# Patient Record
Sex: Female | Born: 1935 | Race: White | Hispanic: No | Marital: Married | Smoking: Former smoker
Health system: Southern US, Community
[De-identification: ages and names within clinical notes are randomized; demographics above are authoritative.]

## PROBLEM LIST (undated history)

## (undated) DIAGNOSIS — R7302 Impaired glucose tolerance (oral): Secondary | ICD-10-CM

## (undated) DIAGNOSIS — I10 Essential (primary) hypertension: Secondary | ICD-10-CM

## (undated) DIAGNOSIS — E079 Disorder of thyroid, unspecified: Secondary | ICD-10-CM

## (undated) DIAGNOSIS — E785 Hyperlipidemia, unspecified: Secondary | ICD-10-CM

## (undated) DIAGNOSIS — E7800 Pure hypercholesterolemia, unspecified: Secondary | ICD-10-CM

## (undated) DIAGNOSIS — E119 Type 2 diabetes mellitus without complications: Secondary | ICD-10-CM

## (undated) HISTORY — DX: Essential (primary) hypertension: I10

## (undated) HISTORY — DX: Impaired glucose tolerance (oral): R73.02

## (undated) HISTORY — DX: Hyperlipidemia, unspecified: E78.5

## (undated) HISTORY — PX: OTHER SURGICAL HISTORY: SHX169

## (undated) HISTORY — PX: COLONOSCOPY: SHX174

## (undated) HISTORY — PX: TUBAL LIGATION: SHX77

## (undated) HISTORY — PX: APPENDECTOMY: SHX54

---

## 2003-02-27 ENCOUNTER — Encounter: Payer: Self-pay | Admitting: Family Medicine

## 2003-02-27 ENCOUNTER — Ambulatory Visit (HOSPITAL_COMMUNITY): Admission: RE | Admit: 2003-02-27 | Discharge: 2003-02-27 | Payer: Self-pay | Admitting: Family Medicine

## 2003-07-08 ENCOUNTER — Ambulatory Visit (HOSPITAL_BASED_OUTPATIENT_CLINIC_OR_DEPARTMENT_OTHER): Admission: RE | Admit: 2003-07-08 | Discharge: 2003-07-08 | Payer: Self-pay | Admitting: Orthopedic Surgery

## 2009-02-05 ENCOUNTER — Telehealth (INDEPENDENT_AMBULATORY_CARE_PROVIDER_SITE_OTHER): Payer: Self-pay | Admitting: *Deleted

## 2009-02-06 ENCOUNTER — Encounter (INDEPENDENT_AMBULATORY_CARE_PROVIDER_SITE_OTHER): Payer: Self-pay | Admitting: *Deleted

## 2010-05-01 ENCOUNTER — Telehealth: Payer: Self-pay | Admitting: Internal Medicine

## 2011-01-26 NOTE — Progress Notes (Signed)
Summary: Change in GI care  Phone Note Outgoing Call Call back at [facility] Phone [phone]   Call placed by: Harlow Mares CMA Duncan Dull),  May 01, 2010 2:55 PM Call placed to: Patient Summary of Call: patient is going to [facility] now due to her husbands age.  Initial call taken by: Harlow Mares CMA Duncan Dull),  May 01, 2010 2:55 PM

## 2011-06-09 ENCOUNTER — Other Ambulatory Visit (HOSPITAL_COMMUNITY): Payer: Self-pay | Admitting: Internal Medicine

## 2011-06-09 DIAGNOSIS — E782 Mixed hyperlipidemia: Secondary | ICD-10-CM

## 2011-06-16 ENCOUNTER — Ambulatory Visit (HOSPITAL_COMMUNITY)
Admission: RE | Admit: 2011-06-16 | Discharge: 2011-06-16 | Disposition: A | Discharge status: Home / Self Care | Payer: Medicare Other | Source: Ambulatory Visit | Attending: Internal Medicine | Admitting: Internal Medicine

## 2011-06-16 DIAGNOSIS — E782 Mixed hyperlipidemia: Secondary | ICD-10-CM

## 2011-06-16 DIAGNOSIS — M899 Disorder of bone, unspecified: Secondary | ICD-10-CM | POA: Insufficient documentation

## 2011-06-16 DIAGNOSIS — Z780 Asymptomatic menopausal state: Secondary | ICD-10-CM | POA: Insufficient documentation

## 2011-11-12 ENCOUNTER — Ambulatory Visit (HOSPITAL_COMMUNITY)
Admission: RE | Admit: 2011-11-12 | Discharge: 2011-11-12 | Disposition: A | Discharge status: Home / Self Care | Payer: Medicare Other | Source: Ambulatory Visit | Attending: Internal Medicine | Admitting: Internal Medicine

## 2011-11-12 ENCOUNTER — Other Ambulatory Visit (HOSPITAL_COMMUNITY): Payer: Self-pay | Admitting: Internal Medicine

## 2011-11-12 DIAGNOSIS — M25579 Pain in unspecified ankle and joints of unspecified foot: Secondary | ICD-10-CM

## 2012-03-13 ENCOUNTER — Encounter: Payer: Self-pay | Admitting: Cardiology

## 2012-03-14 ENCOUNTER — Ambulatory Visit (INDEPENDENT_AMBULATORY_CARE_PROVIDER_SITE_OTHER): Payer: Medicare Other | Admitting: Cardiology

## 2012-03-14 ENCOUNTER — Encounter: Payer: Self-pay | Admitting: Cardiology

## 2012-03-14 VITALS — BP 149/73 | HR 94 | Resp 16 | Ht 65.0 in | Wt 169.0 lb

## 2012-03-14 DIAGNOSIS — R9431 Abnormal electrocardiogram [ECG] [EKG]: Secondary | ICD-10-CM | POA: Insufficient documentation

## 2012-03-14 DIAGNOSIS — R7309 Other abnormal glucose: Secondary | ICD-10-CM

## 2012-03-14 DIAGNOSIS — R0989 Other specified symptoms and signs involving the circulatory and respiratory systems: Secondary | ICD-10-CM | POA: Insufficient documentation

## 2012-03-14 DIAGNOSIS — I10 Essential (primary) hypertension: Secondary | ICD-10-CM

## 2012-03-14 DIAGNOSIS — R7302 Impaired glucose tolerance (oral): Secondary | ICD-10-CM | POA: Insufficient documentation

## 2012-03-14 DIAGNOSIS — E782 Mixed hyperlipidemia: Secondary | ICD-10-CM

## 2012-03-14 DIAGNOSIS — R0602 Shortness of breath: Secondary | ICD-10-CM

## 2012-03-14 NOTE — Assessment & Plan Note (Signed)
Relatively nonspecific, but in light of symptoms and cardiac risk factors, further evaluation is planned.

## 2012-03-14 NOTE — Assessment & Plan Note (Signed)
Possible soft right carotid bruit - will evaluate with carotid Dopplers.

## 2012-03-14 NOTE — Assessment & Plan Note (Signed)
On statin therapy, followed by Dr. Fusco. 

## 2012-03-14 NOTE — Assessment & Plan Note (Signed)
Noted with activity as outlined above. No exertional chest pain. Cardiac risk factors include hypertension, hyperlipidemia, and impaired glucose tolerance. Baseline ECG is abnormal as well. Plan will be to assess for ischemia with Lexiscan Myoview (plantar fasciitis limits treadmill). Will inform her of the results and can arrange followup as needed.

## 2012-03-14 NOTE — Progress Notes (Signed)
   Clinical Summary Melissa Kane is a 78 y.o.female referred for cardiology consultation by Dr. Sherwood Gambler. She reports dyspnea on exertion with activity such as walking up steps, NYHA class II-III. She has had no frank chest pain. Walking has been limited by plantar fasciitis recently, although she has been trying an elliptical machine with some success.  She states that she underwent some type of cardiac testing approximately 9 years ago with [facility]. Does not recall any major abnormalities found at that time.  ECG is noted below. I do not have an old tracing for comparison.  She has had no interval ischemic evaluations. Reports that glucose has been elevated and her blood pressure has been increasing.   No Known Allergies  Current Outpatient Prescriptions  Medication Sig Dispense Refill  . aspirin EC 81 MG tablet Take 81 mg by mouth daily.      Marland Kitchen atorvastatin (LIPITOR) 80 MG tablet Take 80 mg by mouth daily.      Marland Kitchen BISACODYL CO by Combination route.      . Chromium Picolinate 200 MCG CAPS Take by mouth daily.      Marland Kitchen co-enzyme Q-10 30 MG capsule Take 100 mg by mouth daily.      . diphenhydrAMINE (BENADRYL) 25 MG tablet Take 25 mg by mouth every 6 (six) hours as needed.      Marland Kitchen DM-APAP-CPM (CORICIDIN HBP FLU PO) Take by mouth as needed.      Marland Kitchen losartan-hydrochlorothiazide (HYZAAR) 50-12.5 MG per tablet Take 1 tablet by mouth 2 (two) times daily.        Past Medical History  Diagnosis Date  . Hyperlipidemia   . Essential hypertension, benign   . Impaired glucose tolerance     Past Surgical History  Procedure Date  . Appendectomy   . Tubal ligation   . Excision left breast cyst     Family History  Problem Relation Age of Onset  . Cancer Father     Leukemia  . Cancer Sister     Pancreatic  . Cancer Sister     Colon  . COPD Sister     Social History Melissa Kane reports that she has quit smoking. Her smoking use included Cigarettes. She has never used smokeless tobacco. Melissa Kane  reports that she does not drink alcohol.  Review of Systems No palpitations or dizziness. Recent "cold" symptoms. No melena or hematochezia. No claudication. Otherwise negative.  Physical Examination Filed Vitals:   03/14/12 0851  BP: 149/73  Pulse: 94  Resp: 16   Overweight woman in NAD. HEENT: Conjunctiva and lids normal, oropharynx clear. Neck: Supple, no elevated JVP, possible soft right carotid bruit, no thyromegaly. Lungs: Clear to auscultation, nonlabored breathing at rest. Cardiac: Regular rate and rhythm, no S3 or significant systolic murmur, no pericardial rub. Abdomen: Soft, protuberant, nontender, bowel sounds present, no guarding or rebound. Extremities: No pitting edema, distal pulses 2+. Skin: Warm and dry. Musculoskeletal: No kyphosis. Neuropsychiatric: Alert and oriented x3, affect grossly appropriate.   ECG Sinus rhythm with PVC, inferolateral ST depression and T wave flattening.    Problem List and Plan

## 2012-03-14 NOTE — Assessment & Plan Note (Signed)
Followed by Dr Fusco 

## 2012-03-14 NOTE — Assessment & Plan Note (Signed)
Blood pressure is elevated today. Medications are reasonable. We discussed diet and exercise measures.

## 2012-03-14 NOTE — Patient Instructions (Addendum)
**Note De-Identified Melissa Kane Obfuscation** Your physician has requested that you have a lexiscan myoview. For further information please visit [email or web address] Please follow instruction sheet, as given.  Your physician has requested that you have a carotid duplex. This test is an ultrasound of the carotid arteries in your neck. It looks at blood flow through these arteries that supply the brain with blood. Allow one hour for this exam. There are no restrictions or special instructions.  Your physician recommends that you continue on your current medications as directed. Please refer to the Current Medication list given to you today.  Your physician recommends that you schedule a follow-up appointment in: we will contact you with results of test.

## 2012-03-17 ENCOUNTER — Ambulatory Visit (HOSPITAL_COMMUNITY)
Admission: RE | Admit: 2012-03-17 | Discharge: 2012-03-17 | Disposition: A | Discharge status: Home / Self Care | Payer: Medicare Other | Source: Ambulatory Visit | Attending: Cardiology | Admitting: Cardiology

## 2012-03-17 DIAGNOSIS — R0989 Other specified symptoms and signs involving the circulatory and respiratory systems: Secondary | ICD-10-CM | POA: Insufficient documentation

## 2012-03-20 ENCOUNTER — Ambulatory Visit (INDEPENDENT_AMBULATORY_CARE_PROVIDER_SITE_OTHER): Payer: Medicare Other

## 2012-03-20 ENCOUNTER — Encounter (HOSPITAL_COMMUNITY)
Admission: RE | Admit: 2012-03-20 | Discharge: 2012-03-20 | Disposition: A | Discharge status: Home / Self Care | Payer: Medicare Other | Source: Ambulatory Visit | Attending: Cardiology | Admitting: Cardiology

## 2012-03-20 ENCOUNTER — Encounter (HOSPITAL_COMMUNITY): Payer: Self-pay

## 2012-03-20 DIAGNOSIS — I10 Essential (primary) hypertension: Secondary | ICD-10-CM | POA: Insufficient documentation

## 2012-03-20 DIAGNOSIS — R0602 Shortness of breath: Secondary | ICD-10-CM

## 2012-03-20 DIAGNOSIS — R9431 Abnormal electrocardiogram [ECG] [EKG]: Secondary | ICD-10-CM | POA: Insufficient documentation

## 2012-03-20 DIAGNOSIS — E785 Hyperlipidemia, unspecified: Secondary | ICD-10-CM | POA: Insufficient documentation

## 2012-03-20 MED ORDER — TECHNETIUM TC 99M TETROFOSMIN IV KIT
30.0000 | PACK | Freq: Once | INTRAVENOUS | Status: AC | PRN
  Administered 2012-03-20: 31 via INTRAVENOUS

## 2012-03-20 MED ORDER — TECHNETIUM TC 99M TETROFOSMIN IV KIT
10.0000 | PACK | Freq: Once | INTRAVENOUS | Status: AC | PRN
  Administered 2012-03-20: 9 via INTRAVENOUS

## 2012-03-20 NOTE — Progress Notes (Signed)
Stress Lab Nurses Notes - Melissa Kane  Melissa Kane 03/20/2012  Reason for doing test: Dyspnea Type of test: Steffanie Dunn Nurse performing test: Parke Poisson, RN Nuclear Medicine Tech: Lyndel Pleasure Echo Tech: Not Applicable MD performing test: Sinclair Ship NP Family MD: Sherwood Gambler Test explained and consent signed: yes IV started: 22g jelco, Saline lock flushed, No redness or edema and Saline lock started in radiology Symptoms: Flushed Treatment/Intervention: None Reason test stopped: protocol completed After recovery IV was: Discontinued via X-ray tech and No redness or edema Patient to return to Nuc. Med at : 12:30 Patient discharged: Home Patient's Condition upon discharge was: stable Comments: During test BP 142/62 & HR 93.  Recovery BP 138/62 & HR 86.  Symptoms resolved in recovery. [name] T

## 2012-08-21 ENCOUNTER — Encounter (HOSPITAL_COMMUNITY): Payer: Self-pay | Admitting: Cardiology

## 2013-07-09 ENCOUNTER — Other Ambulatory Visit (HOSPITAL_COMMUNITY): Payer: Self-pay | Admitting: Internal Medicine

## 2013-07-09 DIAGNOSIS — Z0000 Encounter for general adult medical examination without abnormal findings: Secondary | ICD-10-CM

## 2013-07-12 ENCOUNTER — Ambulatory Visit (HOSPITAL_COMMUNITY)
Admission: RE | Admit: 2013-07-12 | Discharge: 2013-07-12 | Disposition: A | Discharge status: Home / Self Care | Payer: Medicare Other | Source: Ambulatory Visit | Attending: Internal Medicine | Admitting: Internal Medicine

## 2013-07-12 DIAGNOSIS — M818 Other osteoporosis without current pathological fracture: Secondary | ICD-10-CM | POA: Insufficient documentation

## 2013-07-12 DIAGNOSIS — Z0000 Encounter for general adult medical examination without abnormal findings: Secondary | ICD-10-CM

## 2014-12-04 ENCOUNTER — Encounter (INDEPENDENT_AMBULATORY_CARE_PROVIDER_SITE_OTHER): Payer: Self-pay

## 2015-03-14 DIAGNOSIS — Z1231 Encounter for screening mammogram for malignant neoplasm of breast: Secondary | ICD-10-CM | POA: Diagnosis not present

## 2015-04-04 DIAGNOSIS — D1801 Hemangioma of skin and subcutaneous tissue: Secondary | ICD-10-CM | POA: Diagnosis not present

## 2015-04-04 DIAGNOSIS — D2361 Other benign neoplasm of skin of right upper limb, including shoulder: Secondary | ICD-10-CM | POA: Diagnosis not present

## 2015-04-04 DIAGNOSIS — L918 Other hypertrophic disorders of the skin: Secondary | ICD-10-CM | POA: Diagnosis not present

## 2015-04-04 DIAGNOSIS — L821 Other seborrheic keratosis: Secondary | ICD-10-CM | POA: Diagnosis not present

## 2015-04-17 DIAGNOSIS — D225 Melanocytic nevi of trunk: Secondary | ICD-10-CM | POA: Diagnosis not present

## 2015-04-17 DIAGNOSIS — D1801 Hemangioma of skin and subcutaneous tissue: Secondary | ICD-10-CM | POA: Diagnosis not present

## 2015-04-28 ENCOUNTER — Other Ambulatory Visit (INDEPENDENT_AMBULATORY_CARE_PROVIDER_SITE_OTHER): Payer: Self-pay | Admitting: *Deleted

## 2015-04-28 ENCOUNTER — Encounter (INDEPENDENT_AMBULATORY_CARE_PROVIDER_SITE_OTHER): Payer: Self-pay | Admitting: Internal Medicine

## 2015-04-28 ENCOUNTER — Telehealth (INDEPENDENT_AMBULATORY_CARE_PROVIDER_SITE_OTHER): Payer: Self-pay | Admitting: *Deleted

## 2015-04-28 ENCOUNTER — Ambulatory Visit (INDEPENDENT_AMBULATORY_CARE_PROVIDER_SITE_OTHER): Payer: Medicare Other | Admitting: Internal Medicine

## 2015-04-28 VITALS — BP 110/72 | HR 78 | Temp 98.0°F | Resp 18 | Ht 65.0 in | Wt 180.6 lb

## 2015-04-28 DIAGNOSIS — Z1212 Encounter for screening for malignant neoplasm of rectum: Secondary | ICD-10-CM

## 2015-04-28 DIAGNOSIS — K5909 Other constipation: Secondary | ICD-10-CM

## 2015-04-28 DIAGNOSIS — Z1211 Encounter for screening for malignant neoplasm of colon: Secondary | ICD-10-CM | POA: Diagnosis not present

## 2015-04-28 DIAGNOSIS — K5900 Constipation, unspecified: Secondary | ICD-10-CM | POA: Diagnosis not present

## 2015-04-28 MED ORDER — PEG 3350-KCL-NA BICARB-NACL 420 G PO SOLR: 4000.0000 mL | Freq: Once | ORAL | Status: DC

## 2015-04-28 NOTE — Patient Instructions (Addendum)
Hemoccult 1 Colonoscopy to be scheduled under fluoroscopy. Try to exercise as much as you can at least 3-4 times a week. Discuss treatment options for osteoporosis on your next visit with Dr. Gerarda Fraction.

## 2015-04-28 NOTE — Telephone Encounter (Signed)
Patient needs trilyte 

## 2015-04-28 NOTE — Progress Notes (Signed)
Presenting complaint;  Patient is interested in colonoscopy.  History of present illness  Patient is 81 year old Caucasian female who is  Interested in colonoscopy. She was last seen by me in October 2010 when she underwent colonoscopy at [facility] in [facility]. Examination was incomplete to splenic flexure. She had single small polyp removed but was not an adenoma. She subsequently had barium enema to complete her evaluation.  She is interested in colonoscopy because of family history of CRC in her sister. Her sister was in her early 53s of the time of diagnosis and died of metastatic disease at 31. Patient complains of chronic constipation. She is having regular bowel movements with OTC laxative. She has very good appetite. She has gained 5 pounds in the last 4 months. She denies abdominal pain melena or rectal bleeding. She denies nausea vomiting or dysphagia. She has heartburn only when she eats sweets.  She stays busy. She does usual household work and also maintains her yard. However she does not do prolonged walking because of plantar fasciitis.   Current Medications: Current Outpatient Prescriptions  Medication Sig Dispense Refill  . aspirin EC 81 MG tablet Take 81 mg by mouth daily.    Marland Kitchen atorvastatin (LIPITOR) 80 MG tablet Take 80 mg by mouth daily.    . Bisacodyl (WOMENS GENTLE LAXATIVE PO) Take 1 tablet by mouth at bedtime as needed.    Marland Kitchen co-enzyme Q-10 30 MG capsule Take 100 mg by mouth daily.    . diphenhydrAMINE (BENADRYL) 25 MG tablet Take 25 mg by mouth at bedtime as needed.     Marland Kitchen losartan-hydrochlorothiazide (HYZAAR) 50-12.5 MG per tablet Take 1 tablet by mouth 2 (two) times daily.    . polyethylene glycol-electrolytes (NULYTELY/GOLYTELY) 420 G solution Take 4,000 mLs by mouth once. 4000 mL 0   No current facility-administered medications for this visit.   Past Medical History  Diagnosis Date  . Hyperlipidemia   . Essential hypertension, benign   .  incomplete  colonoscopy in October 2010 followed by barium enema as above         Osteoporosis. Patient was given medication which she stopped after few days.       History of plantar fasciitis   Past Surgical History  Procedure Laterality Date  . Appendectomy. Appendix was removed at the time of tubal ligation    . Tubal ligation    . Excision left breast cyst      bilateral cataract extraction      left shoulder surgery for tendon repair    Allergies; No Known Allergies    Family history;  Both parents lived to be in their 22s. Father died of leukemia and mother died due to complications following cholecystectomy.  She had one brother who died an bulldozer accident and he was around 79 years old.  She has one sister living and 3 sisters are deceased.  One sister was diagnosed with CRC in early 58s and died at 86. Another sister died of pancreatic carcinoma within few months of diagnosis at age 17 and third sister diet due to COPD.  Social history;  She is married.  Her husband has history of gastric carcinoma. She has two sons and they're both doing well. One of her sons had aortic stent placement he was in his 38s and doing fine at age 59. She worked at [employer] and subsequently at [employer] for more than 35 years before her retirement. She does not smoke cigarettes or drink alcohol.  Objective: Blood pressure 110/72, pulse 78, temperature 98 F (36.7 C), temperature source Oral, resp. rate 18, height 5\' 5"  (1.651 m), weight 180 lb 9.6 oz (81.92 kg). Patient is alert and in no acute distress. Conjunctiva is pink. Sclera is nonicteric Oropharyngeal mucosa is normal. No neck masses or thyromegaly noted. Cardiac exam with regular rhythm normal S1 and S2. No murmur or gallop noted. Lungs are clear to auscultation. Abdomen is full. On palpation it is soft and nontender without organomegaly or masses. No LE edema or clubbing noted.    Assessment:  #1.  Patient is felt to be  high risk for CRC given family history of CRC in sister. Last colonoscopy was incomplete in October 2010 and was followed by barium enema. She appears to be in excellent health and therefore would be appropriate for her to undergo CRC screening. Since she has very tortuous colon examination would be performed under fluoroscopy. #2.  Chronic constipation with dependence on laxatives. She will continue OTC laxative as long that is working. #3.  History of osteoporosis. Patient did not take medication as prescribed by Dr. Gerarda Fraction.   Recommendations;   high risk screening colonoscopy to be scheduled under fluoroscopy since she has redundant, tortuous colon and last exam was incomplete. Patient advised to follow with Dr. Gerarda Fraction did discuss other treatment options regarding osteoporosis

## 2015-04-30 ENCOUNTER — Telehealth (INDEPENDENT_AMBULATORY_CARE_PROVIDER_SITE_OTHER): Payer: Self-pay | Admitting: *Deleted

## 2015-04-30 NOTE — Telephone Encounter (Signed)
   Diagnosis:    Result(s)   Card 1:Negative:          Completed by:    HEMOCCULT SENSA DEVELOPER: LOT#:  [id number] EXPIRATION DATE: 9-17   HEMOCCULT SENSA CARD:  LOT#:  [id number] EXPIRATION DATE: 07-18  CARD CONTROL RESULTS:  POSITIVE: Positive NEGATIVE: Negative    ADDITIONAL COMMENTS: Patient was called and given her result.

## 2015-05-01 NOTE — Telephone Encounter (Signed)
Stool is guaiac negative. 

## 2015-06-18 ENCOUNTER — Encounter (HOSPITAL_COMMUNITY)
Admission: RE | Disposition: A | Discharge status: Home / Self Care | Payer: Self-pay | Source: Ambulatory Visit | Attending: Internal Medicine

## 2015-06-18 ENCOUNTER — Ambulatory Visit (HOSPITAL_COMMUNITY): Payer: Medicare Other

## 2015-06-18 ENCOUNTER — Ambulatory Visit (HOSPITAL_COMMUNITY)
Admission: RE | Admit: 2015-06-18 | Discharge: 2015-06-18 | Disposition: A | Discharge status: Home / Self Care | Payer: Medicare Other | Source: Ambulatory Visit | Attending: Internal Medicine | Admitting: Internal Medicine

## 2015-06-18 ENCOUNTER — Encounter (HOSPITAL_COMMUNITY): Payer: Self-pay | Admitting: Emergency Medicine

## 2015-06-18 DIAGNOSIS — K5900 Constipation, unspecified: Secondary | ICD-10-CM | POA: Insufficient documentation

## 2015-06-18 DIAGNOSIS — D125 Benign neoplasm of sigmoid colon: Secondary | ICD-10-CM | POA: Insufficient documentation

## 2015-06-18 DIAGNOSIS — Z1211 Encounter for screening for malignant neoplasm of colon: Secondary | ICD-10-CM | POA: Insufficient documentation

## 2015-06-18 DIAGNOSIS — K644 Residual hemorrhoidal skin tags: Secondary | ICD-10-CM | POA: Insufficient documentation

## 2015-06-18 DIAGNOSIS — E785 Hyperlipidemia, unspecified: Secondary | ICD-10-CM | POA: Insufficient documentation

## 2015-06-18 DIAGNOSIS — Z87891 Personal history of nicotine dependence: Secondary | ICD-10-CM | POA: Diagnosis not present

## 2015-06-18 DIAGNOSIS — R7302 Impaired glucose tolerance (oral): Secondary | ICD-10-CM | POA: Insufficient documentation

## 2015-06-18 DIAGNOSIS — Z800 Family history of malignant neoplasm of digestive organs: Secondary | ICD-10-CM | POA: Insufficient documentation

## 2015-06-18 DIAGNOSIS — D123 Benign neoplasm of transverse colon: Secondary | ICD-10-CM | POA: Diagnosis not present

## 2015-06-18 DIAGNOSIS — Z7982 Long term (current) use of aspirin: Secondary | ICD-10-CM | POA: Diagnosis not present

## 2015-06-18 DIAGNOSIS — K648 Other hemorrhoids: Secondary | ICD-10-CM | POA: Diagnosis not present

## 2015-06-18 DIAGNOSIS — K6289 Other specified diseases of anus and rectum: Secondary | ICD-10-CM | POA: Diagnosis not present

## 2015-06-18 DIAGNOSIS — I10 Essential (primary) hypertension: Secondary | ICD-10-CM | POA: Diagnosis not present

## 2015-06-18 DIAGNOSIS — Z79899 Other long term (current) drug therapy: Secondary | ICD-10-CM | POA: Insufficient documentation

## 2015-06-18 HISTORY — PX: COLONOSCOPY: SHX5424

## 2015-06-18 SURGERY — COLONOSCOPY
Anesthesia: Moderate Sedation

## 2015-06-18 MED ORDER — ONDANSETRON HCL 4 MG/2ML IJ SOLN
INTRAMUSCULAR | Status: DC | PRN
  Administered 2015-06-18: 4 mg via INTRAVENOUS

## 2015-06-18 MED ORDER — ONDANSETRON HCL 4 MG/2ML IJ SOLN
INTRAMUSCULAR | Status: AC
  Filled 2015-06-18: qty 2

## 2015-06-18 MED ORDER — STERILE WATER FOR IRRIGATION IR SOLN
Status: DC | PRN
  Administered 2015-06-18: 09:00:00

## 2015-06-18 MED ORDER — MIDAZOLAM HCL 5 MG/5ML IJ SOLN
INTRAMUSCULAR | Status: AC
  Filled 2015-06-18: qty 10

## 2015-06-18 MED ORDER — MEPERIDINE HCL 50 MG/ML IJ SOLN
INTRAMUSCULAR | Status: DC | PRN
  Administered 2015-06-18 (×2): 25 mg via INTRAVENOUS

## 2015-06-18 MED ORDER — SODIUM CHLORIDE 0.9 % IV SOLN
INTRAVENOUS | Status: DC
  Administered 2015-06-18: 20 mL/h via INTRAVENOUS

## 2015-06-18 MED ORDER — MEPERIDINE HCL 50 MG/ML IJ SOLN
INTRAMUSCULAR | Status: AC
  Filled 2015-06-18: qty 1

## 2015-06-18 MED ORDER — MIDAZOLAM HCL 5 MG/5ML IJ SOLN
INTRAMUSCULAR | Status: DC | PRN
  Administered 2015-06-18: 1 mg via INTRAVENOUS
  Administered 2015-06-18: 2 mg via INTRAVENOUS
  Administered 2015-06-18: 1 mg via INTRAVENOUS

## 2015-06-18 NOTE — Op Note (Signed)
COLONOSCOPY PROCEDURE REPORT  PATIENT:  Melissa Kane  MR#:  [id number] Birthdate:  [birth year], 81 y.o., female Endoscopist:  Dr. Rogene Houston, MD Referred By:  Dr. Glo Herring, MD  Procedure Date: 06/18/2015  Procedure:   Colonoscopy with snare polypectomy  Indications:  Patient is 81 year old Caucasian female who is undergoing high risk screening colonoscopy. Her last exam in 2010 was incomplete to splenic flexure. She is therefore undergoing exam under fluoroscopy.  Informed Consent:  The procedure and risks were reviewed with the patient and informed consent was obtained.  Medications:  Demerol 50 mg IV Versed 5 mg IV Ondansetron 4 mg IV  Description of procedure:  After a digital rectal exam was performed, that colonoscope was advanced from the anus through the rectum and colon to the area of the cecum, ileocecal valve and appendiceal orifice. The cecum was deeply intubated. These structures were well-seen and photographed for the record. From the level of the cecum and ileocecal valve, the scope was slowly and cautiously withdrawn. The mucosal surfaces were carefully surveyed utilizing scope tip to flexion to facilitate fold flattening as needed. The scope was pulled down into the rectum where a thorough exam including retroflexion was performed.  Findings:   Prep excellent. Fluoroscopy revealed U-shaped turn at splenic flexure. Abdominal pressure in left upper quadrant facilitated passage of scope proximally. 4 mm polyp cold snared from distal transverse colon. 8 mm polyp hot snared from sigmoid colon. Normal rectal mucosa. Small hemorrhoids below the dentate line and single anal papilla.   Therapeutic/Diagnostic Maneuvers Performed:  See above  Complications:  None  Cecal Withdrawal Time:  11 minutes  Impression:  Examination performed to cecum. 4 mm polyp cold snared from distal transverse colon. 8 mm polyp hot snared from sigmoid colon. Small external  hemorrhoids and single anal papilla.  Recommendations:  Standard instructions given. No aspirin for 1 week. I will contact patient with biopsy results and further recommendations.  REHMAN,NAJEEB U  06/18/2015 9:35 AM  CC: Dr. Glo Herring., MD & Dr. Rayne Du ref. provider found

## 2015-06-18 NOTE — H&P (Signed)
Melissa Kane is an 81 y.o. female.   Chief Complaint: Patient is here for colonoscopy. HPI: Patient is 81 year old Caucasian female who is here for screening colonoscopy. She denies abdominal pain rectal bleeding. She has constipation for which she takes OTC laxative. Patient's last colonoscopy was in 2010 and incomplete. She is therefore undergoing exam fluoroscopy. Family history significant for CRC in her sister who was in her 83s and died a few is later metastatic disease.    Past Medical History  Diagnosis Date  . Hyperlipidemia   . Essential hypertension, benign   . Impaired glucose tolerance     Past Surgical History  Procedure Laterality Date  . Appendectomy    . Tubal ligation    . Excision left breast cyst    . Colonoscopy      Patient states she had in [place]/Dr.Rehman greater than 6 years,    Family History  Problem Relation Age of Onset  . Cancer Father     Leukemia  . Cancer Sister     Pancreatic  . Cancer Sister     Colon  . COPD Sister    Social History:  reports that she has quit smoking. Her smoking use included Cigarettes. She has never used smokeless tobacco. She reports that she does not drink alcohol or use illicit drugs.  Allergies: No Known Allergies  Medications Prior to Admission  Medication Sig Dispense Refill  . aspirin EC 81 MG tablet Take 81 mg by mouth daily.    Marland Kitchen atorvastatin (LIPITOR) 80 MG tablet Take 80 mg by mouth daily.    . Bisacodyl (WOMENS GENTLE LAXATIVE PO) Take 1 tablet by mouth at bedtime as needed.    . Calcium Carb-Cholecalciferol 600-200 MG-UNIT TABS Take 1 tablet by mouth daily.    . diphenhydrAMINE (BENADRYL) 25 MG tablet Take 25 mg by mouth at bedtime as needed.     Marland Kitchen losartan-hydrochlorothiazide (HYZAAR) 50-12.5 MG per tablet Take 1 tablet by mouth 2 (two) times daily.    . polyethylene glycol-electrolytes (NULYTELY/GOLYTELY) 420 G solution Take 4,000 mLs by mouth once. 4000 mL 0    No results found for this or any  previous visit (from the past 48 hour(s)). No results found.  ROS  Blood pressure 149/72, pulse 80, temperature 97.9 F (36.6 C), temperature source Oral, resp. rate 22, height 5\' 5"  (1.651 m), weight 180 lb (81.647 kg), SpO2 94 %. Physical Exam  Constitutional: She appears well-developed and well-nourished.  HENT:  Mouth/Throat: Oropharynx is clear and moist.  Eyes: Conjunctivae are normal. No scleral icterus.  Neck: No thyromegaly present.  Cardiovascular: Normal rate, regular rhythm and normal heart sounds.   No murmur heard. GI: Soft. She exhibits no distension and no mass. There is no tenderness.  Musculoskeletal: She exhibits no edema.  Lymphadenopathy:    She has no cervical adenopathy.  Neurological: She is alert.  Skin: Skin is warm and dry.     Assessment/Plan High risk screening colonoscopy. Family history of CRC in one sister. Colonoscopy under fluoroscopy since patient has a tortuous colon.  REHMAN,NAJEEB U 06/18/2015, 8:30 AM

## 2015-06-18 NOTE — Discharge Instructions (Signed)
No aspirin for 1 week. Resume other medications as before. Resume usual diet. No driving for 24 hours. Physician will call with biopsy results.      Colonoscopy, Care After These instructions give you information on caring for yourself after your procedure. Your doctor may also give you more specific instructions. Call your doctor if you have any problems or questions after your procedure. HOME CARE  Do not drive for 24 hours.  Do not sign important papers or use machinery for 24 hours.  You may shower.  You may go back to your usual activities, but go slower for the first 24 hours.  Take rest breaks often during the first 24 hours.  Walk around or use warm packs on your belly (abdomen) if you have belly cramping or gas.  Drink enough fluids to keep your pee (urine) clear or pale yellow.  Resume your normal diet. Avoid heavy or fried foods.  Avoid drinking alcohol for 24 hours or as told by your doctor.  Only take medicines as told by your doctor. If a tissue sample (biopsy) was taken during the procedure:   Do not take aspirin or blood thinners for 7 days, or as told by your doctor.  Do not drink alcohol for 7 days, or as told by your doctor.  Eat soft foods for the first 24 hours. GET HELP IF: You still have a small amount of blood in your poop (stool) 2-3 days after the procedure. GET HELP RIGHT AWAY IF:  You have more than a small amount of blood in your poop.  You see clumps of tissue (blood clots) in your poop.  Your belly is puffy (swollen).  You feel sick to your stomach (nauseous) or throw up (vomit).  You have a fever.  You have belly pain that gets worse and medicine does not help. MAKE SURE YOU:  Understand these instructions.  Will watch your condition.  Will get help right away if you are not doing well or get worse. Document Released: 01/15/2011 Document Revised: 12/18/2013 Document Reviewed: 08/20/2013 [facility] Patient Information 2015  [place], [place]. This information is not intended to replace advice given to you by your health care provider. Make sure you discuss any questions you have with your health care provider.

## 2015-06-23 ENCOUNTER — Encounter (HOSPITAL_COMMUNITY): Payer: Self-pay | Admitting: Internal Medicine

## 2015-07-04 ENCOUNTER — Encounter (INDEPENDENT_AMBULATORY_CARE_PROVIDER_SITE_OTHER): Payer: Self-pay | Admitting: *Deleted

## 2015-09-22 DIAGNOSIS — Z6828 Body mass index (BMI) 28.0-28.9, adult: Secondary | ICD-10-CM | POA: Diagnosis not present

## 2015-09-22 DIAGNOSIS — E663 Overweight: Secondary | ICD-10-CM | POA: Diagnosis not present

## 2015-09-22 DIAGNOSIS — Z23 Encounter for immunization: Secondary | ICD-10-CM | POA: Diagnosis not present

## 2015-09-22 DIAGNOSIS — Z1389 Encounter for screening for other disorder: Secondary | ICD-10-CM | POA: Diagnosis not present

## 2015-09-22 DIAGNOSIS — E063 Autoimmune thyroiditis: Secondary | ICD-10-CM | POA: Diagnosis not present

## 2015-09-22 DIAGNOSIS — I10 Essential (primary) hypertension: Secondary | ICD-10-CM | POA: Diagnosis not present

## 2015-09-22 DIAGNOSIS — N183 Chronic kidney disease, stage 3 (moderate): Secondary | ICD-10-CM | POA: Diagnosis not present

## 2015-09-22 DIAGNOSIS — E782 Mixed hyperlipidemia: Secondary | ICD-10-CM | POA: Diagnosis not present

## 2015-10-17 DIAGNOSIS — E039 Hypothyroidism, unspecified: Secondary | ICD-10-CM | POA: Diagnosis not present

## 2015-10-17 DIAGNOSIS — I10 Essential (primary) hypertension: Secondary | ICD-10-CM | POA: Diagnosis not present

## 2015-10-17 DIAGNOSIS — E063 Autoimmune thyroiditis: Secondary | ICD-10-CM | POA: Diagnosis not present

## 2015-10-17 DIAGNOSIS — E782 Mixed hyperlipidemia: Secondary | ICD-10-CM | POA: Diagnosis not present

## 2015-10-17 DIAGNOSIS — Z1389 Encounter for screening for other disorder: Secondary | ICD-10-CM | POA: Diagnosis not present

## 2015-10-17 DIAGNOSIS — Z6828 Body mass index (BMI) 28.0-28.9, adult: Secondary | ICD-10-CM | POA: Diagnosis not present

## 2015-10-17 DIAGNOSIS — N183 Chronic kidney disease, stage 3 (moderate): Secondary | ICD-10-CM | POA: Diagnosis not present

## 2016-03-11 DIAGNOSIS — E782 Mixed hyperlipidemia: Secondary | ICD-10-CM | POA: Diagnosis not present

## 2016-03-11 DIAGNOSIS — N182 Chronic kidney disease, stage 2 (mild): Secondary | ICD-10-CM | POA: Diagnosis not present

## 2016-03-11 DIAGNOSIS — M779 Enthesopathy, unspecified: Secondary | ICD-10-CM | POA: Diagnosis not present

## 2016-03-11 DIAGNOSIS — Z1389 Encounter for screening for other disorder: Secondary | ICD-10-CM | POA: Diagnosis not present

## 2016-03-11 DIAGNOSIS — I10 Essential (primary) hypertension: Secondary | ICD-10-CM | POA: Diagnosis not present

## 2016-03-15 DIAGNOSIS — Z1231 Encounter for screening mammogram for malignant neoplasm of breast: Secondary | ICD-10-CM | POA: Diagnosis not present

## 2016-03-16 DIAGNOSIS — Z961 Presence of intraocular lens: Secondary | ICD-10-CM | POA: Diagnosis not present

## 2016-03-16 DIAGNOSIS — H26493 Other secondary cataract, bilateral: Secondary | ICD-10-CM | POA: Diagnosis not present

## 2017-03-14 DIAGNOSIS — Z1231 Encounter for screening mammogram for malignant neoplasm of breast: Secondary | ICD-10-CM | POA: Diagnosis not present

## 2017-03-22 DIAGNOSIS — N6001 Solitary cyst of right breast: Secondary | ICD-10-CM | POA: Diagnosis not present

## 2017-03-22 DIAGNOSIS — R922 Inconclusive mammogram: Secondary | ICD-10-CM | POA: Diagnosis not present

## 2017-06-02 DIAGNOSIS — N183 Chronic kidney disease, stage 3 (moderate): Secondary | ICD-10-CM | POA: Diagnosis not present

## 2017-06-02 DIAGNOSIS — E1165 Type 2 diabetes mellitus with hyperglycemia: Secondary | ICD-10-CM | POA: Diagnosis not present

## 2017-06-02 DIAGNOSIS — Z1389 Encounter for screening for other disorder: Secondary | ICD-10-CM | POA: Diagnosis not present

## 2017-06-02 DIAGNOSIS — R7301 Impaired fasting glucose: Secondary | ICD-10-CM | POA: Diagnosis not present

## 2017-06-02 DIAGNOSIS — E782 Mixed hyperlipidemia: Secondary | ICD-10-CM | POA: Diagnosis not present

## 2017-06-02 DIAGNOSIS — R81 Glycosuria: Secondary | ICD-10-CM | POA: Diagnosis not present

## 2017-06-16 DIAGNOSIS — R944 Abnormal results of kidney function studies: Secondary | ICD-10-CM | POA: Diagnosis not present

## 2017-07-25 DIAGNOSIS — N183 Chronic kidney disease, stage 3 (moderate): Secondary | ICD-10-CM | POA: Diagnosis not present

## 2017-07-25 DIAGNOSIS — E1129 Type 2 diabetes mellitus with other diabetic kidney complication: Secondary | ICD-10-CM | POA: Diagnosis not present

## 2017-07-25 DIAGNOSIS — I10 Essential (primary) hypertension: Secondary | ICD-10-CM | POA: Diagnosis not present

## 2017-08-05 ENCOUNTER — Other Ambulatory Visit (HOSPITAL_COMMUNITY): Payer: Self-pay | Admitting: Nephrology

## 2017-08-05 DIAGNOSIS — N183 Chronic kidney disease, stage 3 unspecified: Secondary | ICD-10-CM

## 2017-08-16 DIAGNOSIS — Z1389 Encounter for screening for other disorder: Secondary | ICD-10-CM | POA: Diagnosis not present

## 2017-08-16 DIAGNOSIS — R7309 Other abnormal glucose: Secondary | ICD-10-CM | POA: Diagnosis not present

## 2017-08-16 DIAGNOSIS — N183 Chronic kidney disease, stage 3 (moderate): Secondary | ICD-10-CM | POA: Diagnosis not present

## 2017-08-16 DIAGNOSIS — Z0001 Encounter for general adult medical examination with abnormal findings: Secondary | ICD-10-CM | POA: Diagnosis not present

## 2017-08-25 ENCOUNTER — Ambulatory Visit (HOSPITAL_COMMUNITY): Payer: Medicare Other

## 2017-08-25 ENCOUNTER — Ambulatory Visit (HOSPITAL_COMMUNITY)
Admission: RE | Admit: 2017-08-25 | Discharge: 2017-08-25 | Disposition: A | Discharge status: Home / Self Care | Payer: Medicare Other | Source: Ambulatory Visit | Attending: Nephrology | Admitting: Nephrology

## 2017-08-25 ENCOUNTER — Encounter (HOSPITAL_COMMUNITY): Payer: Self-pay

## 2017-08-25 DIAGNOSIS — N183 Chronic kidney disease, stage 3 unspecified: Secondary | ICD-10-CM

## 2017-08-25 DIAGNOSIS — Z1159 Encounter for screening for other viral diseases: Secondary | ICD-10-CM | POA: Diagnosis not present

## 2017-08-25 DIAGNOSIS — N189 Chronic kidney disease, unspecified: Secondary | ICD-10-CM | POA: Diagnosis not present

## 2017-08-25 DIAGNOSIS — D509 Iron deficiency anemia, unspecified: Secondary | ICD-10-CM | POA: Diagnosis not present

## 2017-08-25 DIAGNOSIS — I10 Essential (primary) hypertension: Secondary | ICD-10-CM | POA: Diagnosis not present

## 2017-08-25 DIAGNOSIS — R809 Proteinuria, unspecified: Secondary | ICD-10-CM | POA: Diagnosis not present

## 2017-08-25 DIAGNOSIS — E559 Vitamin D deficiency, unspecified: Secondary | ICD-10-CM | POA: Diagnosis not present

## 2017-08-25 DIAGNOSIS — Z79899 Other long term (current) drug therapy: Secondary | ICD-10-CM | POA: Diagnosis not present

## 2017-09-08 DIAGNOSIS — N6313 Unspecified lump in the right breast, lower outer quadrant: Secondary | ICD-10-CM | POA: Diagnosis not present

## 2017-09-08 DIAGNOSIS — N6001 Solitary cyst of right breast: Secondary | ICD-10-CM | POA: Diagnosis not present

## 2017-09-20 DIAGNOSIS — E1129 Type 2 diabetes mellitus with other diabetic kidney complication: Secondary | ICD-10-CM | POA: Diagnosis not present

## 2017-09-20 DIAGNOSIS — R809 Proteinuria, unspecified: Secondary | ICD-10-CM | POA: Diagnosis not present

## 2017-09-20 DIAGNOSIS — N183 Chronic kidney disease, stage 3 (moderate): Secondary | ICD-10-CM | POA: Diagnosis not present

## 2017-09-20 DIAGNOSIS — I10 Essential (primary) hypertension: Secondary | ICD-10-CM | POA: Diagnosis not present

## 2017-10-11 ENCOUNTER — Ambulatory Visit (HOSPITAL_COMMUNITY)
Admission: RE | Admit: 2017-10-11 | Discharge: 2017-10-11 | Disposition: A | Discharge status: Home / Self Care | Payer: Medicare Other | Source: Ambulatory Visit | Attending: Family Medicine | Admitting: Family Medicine

## 2017-10-11 ENCOUNTER — Other Ambulatory Visit (HOSPITAL_COMMUNITY): Payer: Self-pay | Admitting: Family Medicine

## 2017-10-11 DIAGNOSIS — R7309 Other abnormal glucose: Secondary | ICD-10-CM | POA: Diagnosis not present

## 2017-10-11 DIAGNOSIS — M79662 Pain in left lower leg: Secondary | ICD-10-CM | POA: Insufficient documentation

## 2017-10-11 DIAGNOSIS — Z1389 Encounter for screening for other disorder: Secondary | ICD-10-CM | POA: Diagnosis not present

## 2017-10-11 DIAGNOSIS — Z23 Encounter for immunization: Secondary | ICD-10-CM | POA: Diagnosis not present

## 2017-10-13 DIAGNOSIS — M79662 Pain in left lower leg: Secondary | ICD-10-CM | POA: Diagnosis not present

## 2017-11-11 DIAGNOSIS — M1712 Unilateral primary osteoarthritis, left knee: Secondary | ICD-10-CM | POA: Diagnosis not present

## 2017-12-01 DIAGNOSIS — M1000 Idiopathic gout, unspecified site: Secondary | ICD-10-CM | POA: Diagnosis not present

## 2017-12-01 DIAGNOSIS — E063 Autoimmune thyroiditis: Secondary | ICD-10-CM | POA: Diagnosis not present

## 2017-12-01 DIAGNOSIS — J069 Acute upper respiratory infection, unspecified: Secondary | ICD-10-CM | POA: Diagnosis not present

## 2017-12-01 DIAGNOSIS — I10 Essential (primary) hypertension: Secondary | ICD-10-CM | POA: Diagnosis not present

## 2017-12-28 DIAGNOSIS — D509 Iron deficiency anemia, unspecified: Secondary | ICD-10-CM | POA: Diagnosis not present

## 2017-12-28 DIAGNOSIS — Z79899 Other long term (current) drug therapy: Secondary | ICD-10-CM | POA: Diagnosis not present

## 2017-12-28 DIAGNOSIS — R809 Proteinuria, unspecified: Secondary | ICD-10-CM | POA: Diagnosis not present

## 2017-12-28 DIAGNOSIS — I10 Essential (primary) hypertension: Secondary | ICD-10-CM | POA: Diagnosis not present

## 2017-12-28 DIAGNOSIS — N183 Chronic kidney disease, stage 3 (moderate): Secondary | ICD-10-CM | POA: Diagnosis not present

## 2017-12-28 DIAGNOSIS — E559 Vitamin D deficiency, unspecified: Secondary | ICD-10-CM | POA: Diagnosis not present

## 2018-01-24 DIAGNOSIS — R809 Proteinuria, unspecified: Secondary | ICD-10-CM | POA: Diagnosis not present

## 2018-01-24 DIAGNOSIS — D631 Anemia in chronic kidney disease: Secondary | ICD-10-CM | POA: Diagnosis not present

## 2018-01-24 DIAGNOSIS — E889 Metabolic disorder, unspecified: Secondary | ICD-10-CM | POA: Diagnosis not present

## 2018-01-24 DIAGNOSIS — N183 Chronic kidney disease, stage 3 (moderate): Secondary | ICD-10-CM | POA: Diagnosis not present

## 2018-01-24 DIAGNOSIS — M9080 Osteopathy in diseases classified elsewhere, unspecified site: Secondary | ICD-10-CM | POA: Diagnosis not present

## 2018-02-01 DIAGNOSIS — M1991 Primary osteoarthritis, unspecified site: Secondary | ICD-10-CM | POA: Diagnosis not present

## 2018-02-01 DIAGNOSIS — E1129 Type 2 diabetes mellitus with other diabetic kidney complication: Secondary | ICD-10-CM | POA: Diagnosis not present

## 2018-02-01 DIAGNOSIS — I10 Essential (primary) hypertension: Secondary | ICD-10-CM | POA: Diagnosis not present

## 2018-02-01 DIAGNOSIS — Z1389 Encounter for screening for other disorder: Secondary | ICD-10-CM | POA: Diagnosis not present

## 2018-02-01 DIAGNOSIS — E063 Autoimmune thyroiditis: Secondary | ICD-10-CM | POA: Diagnosis not present

## 2018-02-08 DIAGNOSIS — M1712 Unilateral primary osteoarthritis, left knee: Secondary | ICD-10-CM | POA: Diagnosis not present

## 2018-02-17 DIAGNOSIS — R221 Localized swelling, mass and lump, neck: Secondary | ICD-10-CM | POA: Diagnosis not present

## 2018-02-17 DIAGNOSIS — Z1389 Encounter for screening for other disorder: Secondary | ICD-10-CM | POA: Diagnosis not present

## 2018-03-02 DIAGNOSIS — Z1283 Encounter for screening for malignant neoplasm of skin: Secondary | ICD-10-CM | POA: Diagnosis not present

## 2018-03-02 DIAGNOSIS — D485 Neoplasm of uncertain behavior of skin: Secondary | ICD-10-CM | POA: Diagnosis not present

## 2018-03-02 DIAGNOSIS — L929 Granulomatous disorder of the skin and subcutaneous tissue, unspecified: Secondary | ICD-10-CM | POA: Diagnosis not present

## 2018-03-02 DIAGNOSIS — D225 Melanocytic nevi of trunk: Secondary | ICD-10-CM | POA: Diagnosis not present

## 2018-03-16 DIAGNOSIS — N6310 Unspecified lump in the right breast, unspecified quadrant: Secondary | ICD-10-CM | POA: Diagnosis not present

## 2018-03-16 DIAGNOSIS — N6001 Solitary cyst of right breast: Secondary | ICD-10-CM | POA: Diagnosis not present

## 2018-05-10 DIAGNOSIS — N183 Chronic kidney disease, stage 3 (moderate): Secondary | ICD-10-CM | POA: Diagnosis not present

## 2018-05-10 DIAGNOSIS — E889 Metabolic disorder, unspecified: Secondary | ICD-10-CM | POA: Diagnosis not present

## 2018-05-10 DIAGNOSIS — R809 Proteinuria, unspecified: Secondary | ICD-10-CM | POA: Diagnosis not present

## 2018-05-10 DIAGNOSIS — D631 Anemia in chronic kidney disease: Secondary | ICD-10-CM | POA: Diagnosis not present

## 2018-05-10 DIAGNOSIS — M9080 Osteopathy in diseases classified elsewhere, unspecified site: Secondary | ICD-10-CM | POA: Diagnosis not present

## 2018-05-23 DIAGNOSIS — I10 Essential (primary) hypertension: Secondary | ICD-10-CM | POA: Diagnosis not present

## 2018-05-23 DIAGNOSIS — E1121 Type 2 diabetes mellitus with diabetic nephropathy: Secondary | ICD-10-CM | POA: Diagnosis not present

## 2018-05-23 DIAGNOSIS — N183 Chronic kidney disease, stage 3 (moderate): Secondary | ICD-10-CM | POA: Diagnosis not present

## 2018-05-23 DIAGNOSIS — R809 Proteinuria, unspecified: Secondary | ICD-10-CM | POA: Diagnosis not present

## 2018-07-05 DIAGNOSIS — Z961 Presence of intraocular lens: Secondary | ICD-10-CM | POA: Diagnosis not present

## 2018-07-05 DIAGNOSIS — E119 Type 2 diabetes mellitus without complications: Secondary | ICD-10-CM | POA: Diagnosis not present

## 2018-08-17 DIAGNOSIS — E7849 Other hyperlipidemia: Secondary | ICD-10-CM | POA: Diagnosis not present

## 2018-08-17 DIAGNOSIS — R809 Proteinuria, unspecified: Secondary | ICD-10-CM | POA: Diagnosis not present

## 2018-08-17 DIAGNOSIS — E1129 Type 2 diabetes mellitus with other diabetic kidney complication: Secondary | ICD-10-CM | POA: Diagnosis not present

## 2018-08-17 DIAGNOSIS — E559 Vitamin D deficiency, unspecified: Secondary | ICD-10-CM | POA: Diagnosis not present

## 2018-08-17 DIAGNOSIS — Z1389 Encounter for screening for other disorder: Secondary | ICD-10-CM | POA: Diagnosis not present

## 2018-08-17 DIAGNOSIS — E119 Type 2 diabetes mellitus without complications: Secondary | ICD-10-CM | POA: Diagnosis not present

## 2018-08-17 DIAGNOSIS — I10 Essential (primary) hypertension: Secondary | ICD-10-CM | POA: Diagnosis not present

## 2018-08-17 DIAGNOSIS — Z0000 Encounter for general adult medical examination without abnormal findings: Secondary | ICD-10-CM | POA: Diagnosis not present

## 2018-08-17 DIAGNOSIS — E782 Mixed hyperlipidemia: Secondary | ICD-10-CM | POA: Diagnosis not present

## 2018-08-17 DIAGNOSIS — M1991 Primary osteoarthritis, unspecified site: Secondary | ICD-10-CM | POA: Diagnosis not present

## 2018-08-17 DIAGNOSIS — N183 Chronic kidney disease, stage 3 (moderate): Secondary | ICD-10-CM | POA: Diagnosis not present

## 2018-08-22 DIAGNOSIS — I10 Essential (primary) hypertension: Secondary | ICD-10-CM | POA: Diagnosis not present

## 2018-08-22 DIAGNOSIS — N183 Chronic kidney disease, stage 3 (moderate): Secondary | ICD-10-CM | POA: Diagnosis not present

## 2018-08-22 DIAGNOSIS — E1121 Type 2 diabetes mellitus with diabetic nephropathy: Secondary | ICD-10-CM | POA: Diagnosis not present

## 2018-08-22 DIAGNOSIS — D649 Anemia, unspecified: Secondary | ICD-10-CM | POA: Diagnosis not present

## 2018-10-17 DIAGNOSIS — Z23 Encounter for immunization: Secondary | ICD-10-CM | POA: Diagnosis not present

## 2019-01-16 DIAGNOSIS — I10 Essential (primary) hypertension: Secondary | ICD-10-CM | POA: Diagnosis not present

## 2019-01-16 DIAGNOSIS — Z79899 Other long term (current) drug therapy: Secondary | ICD-10-CM | POA: Diagnosis not present

## 2019-01-16 DIAGNOSIS — N183 Chronic kidney disease, stage 3 (moderate): Secondary | ICD-10-CM | POA: Diagnosis not present

## 2019-01-16 DIAGNOSIS — R809 Proteinuria, unspecified: Secondary | ICD-10-CM | POA: Diagnosis not present

## 2019-01-16 DIAGNOSIS — E559 Vitamin D deficiency, unspecified: Secondary | ICD-10-CM | POA: Diagnosis not present

## 2019-01-23 DIAGNOSIS — E8889 Other specified metabolic disorders: Secondary | ICD-10-CM | POA: Diagnosis not present

## 2019-01-23 DIAGNOSIS — E1121 Type 2 diabetes mellitus with diabetic nephropathy: Secondary | ICD-10-CM | POA: Diagnosis not present

## 2019-01-23 DIAGNOSIS — M9080 Osteopathy in diseases classified elsewhere, unspecified site: Secondary | ICD-10-CM | POA: Diagnosis not present

## 2019-01-23 DIAGNOSIS — I10 Essential (primary) hypertension: Secondary | ICD-10-CM | POA: Diagnosis not present

## 2019-01-23 DIAGNOSIS — N183 Chronic kidney disease, stage 3 (moderate): Secondary | ICD-10-CM | POA: Diagnosis not present

## 2019-04-27 DIAGNOSIS — N183 Chronic kidney disease, stage 3 (moderate): Secondary | ICD-10-CM | POA: Diagnosis not present

## 2019-04-27 DIAGNOSIS — Z1389 Encounter for screening for other disorder: Secondary | ICD-10-CM | POA: Diagnosis not present

## 2019-04-27 DIAGNOSIS — Z0001 Encounter for general adult medical examination with abnormal findings: Secondary | ICD-10-CM | POA: Diagnosis not present

## 2019-04-27 DIAGNOSIS — E1129 Type 2 diabetes mellitus with other diabetic kidney complication: Secondary | ICD-10-CM | POA: Diagnosis not present

## 2019-04-27 DIAGNOSIS — I10 Essential (primary) hypertension: Secondary | ICD-10-CM | POA: Diagnosis not present

## 2019-04-27 DIAGNOSIS — E063 Autoimmune thyroiditis: Secondary | ICD-10-CM | POA: Diagnosis not present

## 2019-05-16 ENCOUNTER — Other Ambulatory Visit: Payer: Self-pay

## 2019-05-16 NOTE — Patient Outreach (Signed)
[facility]) Care Management  05/16/2019  Melissa Kane 1931-04-20 [id number]    Medication Adherence call to [name] [facility] [employer] spoke with patient she explain she is taking Victoza inj.patient is using 0.6 ml every day patient receives a 30 days supply which is enough for 2 month,patient was inform of patients assistance but patient said she does not qualify . Mrs. Cuen is showing past due under [place].  [place] Management Direct Dial [phone]  Fax [phone] [email or web address]

## 2019-06-07 DIAGNOSIS — E1129 Type 2 diabetes mellitus with other diabetic kidney complication: Secondary | ICD-10-CM | POA: Diagnosis not present

## 2019-06-07 DIAGNOSIS — Z0001 Encounter for general adult medical examination with abnormal findings: Secondary | ICD-10-CM | POA: Diagnosis not present

## 2019-06-07 DIAGNOSIS — E7849 Other hyperlipidemia: Secondary | ICD-10-CM | POA: Diagnosis not present

## 2019-07-04 DIAGNOSIS — Z1231 Encounter for screening mammogram for malignant neoplasm of breast: Secondary | ICD-10-CM | POA: Diagnosis not present

## 2019-07-11 ENCOUNTER — Other Ambulatory Visit: Payer: Self-pay

## 2019-07-11 DIAGNOSIS — E1165 Type 2 diabetes mellitus with hyperglycemia: Secondary | ICD-10-CM | POA: Diagnosis not present

## 2019-07-11 DIAGNOSIS — E119 Type 2 diabetes mellitus without complications: Secondary | ICD-10-CM | POA: Diagnosis not present

## 2019-07-11 DIAGNOSIS — Z961 Presence of intraocular lens: Secondary | ICD-10-CM | POA: Diagnosis not present

## 2019-07-11 DIAGNOSIS — H26493 Other secondary cataract, bilateral: Secondary | ICD-10-CM | POA: Diagnosis not present

## 2019-07-11 NOTE — Patient Outreach (Signed)
[place] [facility]) Care Management  07/11/2019  Melissa Kane [birth year] [id number]   Medication Adherence call to [place] [employer] Voice message left with a call back number. [name] is showing past due on Victoza under [place].  [place] Management Direct Dial [phone]  Fax [phone] [email or web address]

## 2019-07-17 DIAGNOSIS — N183 Chronic kidney disease, stage 3 (moderate): Secondary | ICD-10-CM | POA: Diagnosis not present

## 2019-07-17 DIAGNOSIS — I10 Essential (primary) hypertension: Secondary | ICD-10-CM | POA: Diagnosis not present

## 2019-07-17 DIAGNOSIS — E559 Vitamin D deficiency, unspecified: Secondary | ICD-10-CM | POA: Diagnosis not present

## 2019-07-17 DIAGNOSIS — R809 Proteinuria, unspecified: Secondary | ICD-10-CM | POA: Diagnosis not present

## 2019-07-17 DIAGNOSIS — Z79899 Other long term (current) drug therapy: Secondary | ICD-10-CM | POA: Diagnosis not present

## 2019-07-24 DIAGNOSIS — E8889 Other specified metabolic disorders: Secondary | ICD-10-CM | POA: Diagnosis not present

## 2019-07-24 DIAGNOSIS — E1121 Type 2 diabetes mellitus with diabetic nephropathy: Secondary | ICD-10-CM | POA: Diagnosis not present

## 2019-07-24 DIAGNOSIS — I10 Essential (primary) hypertension: Secondary | ICD-10-CM | POA: Diagnosis not present

## 2019-07-24 DIAGNOSIS — N183 Chronic kidney disease, stage 3 (moderate): Secondary | ICD-10-CM | POA: Diagnosis not present

## 2019-10-26 DIAGNOSIS — E7849 Other hyperlipidemia: Secondary | ICD-10-CM | POA: Diagnosis not present

## 2019-10-26 DIAGNOSIS — E1122 Type 2 diabetes mellitus with diabetic chronic kidney disease: Secondary | ICD-10-CM | POA: Diagnosis not present

## 2019-10-26 DIAGNOSIS — N1832 Chronic kidney disease, stage 3b: Secondary | ICD-10-CM | POA: Diagnosis not present

## 2019-10-26 DIAGNOSIS — I129 Hypertensive chronic kidney disease with stage 1 through stage 4 chronic kidney disease, or unspecified chronic kidney disease: Secondary | ICD-10-CM | POA: Diagnosis not present

## 2019-11-26 DIAGNOSIS — N1832 Chronic kidney disease, stage 3b: Secondary | ICD-10-CM | POA: Diagnosis not present

## 2019-11-26 DIAGNOSIS — E1122 Type 2 diabetes mellitus with diabetic chronic kidney disease: Secondary | ICD-10-CM | POA: Diagnosis not present

## 2019-11-26 DIAGNOSIS — E7849 Other hyperlipidemia: Secondary | ICD-10-CM | POA: Diagnosis not present

## 2019-11-26 DIAGNOSIS — I129 Hypertensive chronic kidney disease with stage 1 through stage 4 chronic kidney disease, or unspecified chronic kidney disease: Secondary | ICD-10-CM | POA: Diagnosis not present

## 2019-12-27 DIAGNOSIS — E1122 Type 2 diabetes mellitus with diabetic chronic kidney disease: Secondary | ICD-10-CM | POA: Diagnosis not present

## 2019-12-27 DIAGNOSIS — N1832 Chronic kidney disease, stage 3b: Secondary | ICD-10-CM | POA: Diagnosis not present

## 2019-12-27 DIAGNOSIS — E7849 Other hyperlipidemia: Secondary | ICD-10-CM | POA: Diagnosis not present

## 2019-12-27 DIAGNOSIS — I129 Hypertensive chronic kidney disease with stage 1 through stage 4 chronic kidney disease, or unspecified chronic kidney disease: Secondary | ICD-10-CM | POA: Diagnosis not present

## 2020-01-27 DIAGNOSIS — I129 Hypertensive chronic kidney disease with stage 1 through stage 4 chronic kidney disease, or unspecified chronic kidney disease: Secondary | ICD-10-CM | POA: Diagnosis not present

## 2020-01-27 DIAGNOSIS — N1832 Chronic kidney disease, stage 3b: Secondary | ICD-10-CM | POA: Diagnosis not present

## 2020-01-27 DIAGNOSIS — E7849 Other hyperlipidemia: Secondary | ICD-10-CM | POA: Diagnosis not present

## 2020-01-27 DIAGNOSIS — E1122 Type 2 diabetes mellitus with diabetic chronic kidney disease: Secondary | ICD-10-CM | POA: Diagnosis not present

## 2020-02-24 DIAGNOSIS — E1122 Type 2 diabetes mellitus with diabetic chronic kidney disease: Secondary | ICD-10-CM | POA: Diagnosis not present

## 2020-02-24 DIAGNOSIS — N1832 Chronic kidney disease, stage 3b: Secondary | ICD-10-CM | POA: Diagnosis not present

## 2020-02-24 DIAGNOSIS — I129 Hypertensive chronic kidney disease with stage 1 through stage 4 chronic kidney disease, or unspecified chronic kidney disease: Secondary | ICD-10-CM | POA: Diagnosis not present

## 2020-02-24 DIAGNOSIS — E7849 Other hyperlipidemia: Secondary | ICD-10-CM | POA: Diagnosis not present

## 2020-03-26 DIAGNOSIS — E7849 Other hyperlipidemia: Secondary | ICD-10-CM | POA: Diagnosis not present

## 2020-03-26 DIAGNOSIS — I129 Hypertensive chronic kidney disease with stage 1 through stage 4 chronic kidney disease, or unspecified chronic kidney disease: Secondary | ICD-10-CM | POA: Diagnosis not present

## 2020-03-26 DIAGNOSIS — E1122 Type 2 diabetes mellitus with diabetic chronic kidney disease: Secondary | ICD-10-CM | POA: Diagnosis not present

## 2020-03-26 DIAGNOSIS — N1832 Chronic kidney disease, stage 3b: Secondary | ICD-10-CM | POA: Diagnosis not present

## 2020-04-21 DIAGNOSIS — Z0000 Encounter for general adult medical examination without abnormal findings: Secondary | ICD-10-CM | POA: Diagnosis not present

## 2020-04-21 DIAGNOSIS — E1165 Type 2 diabetes mellitus with hyperglycemia: Secondary | ICD-10-CM | POA: Diagnosis not present

## 2020-04-21 DIAGNOSIS — Z1389 Encounter for screening for other disorder: Secondary | ICD-10-CM | POA: Diagnosis not present

## 2020-04-22 DIAGNOSIS — N182 Chronic kidney disease, stage 2 (mild): Secondary | ICD-10-CM | POA: Diagnosis not present

## 2020-04-22 DIAGNOSIS — I10 Essential (primary) hypertension: Secondary | ICD-10-CM | POA: Diagnosis not present

## 2020-04-22 DIAGNOSIS — E1165 Type 2 diabetes mellitus with hyperglycemia: Secondary | ICD-10-CM | POA: Diagnosis not present

## 2020-04-22 DIAGNOSIS — E7849 Other hyperlipidemia: Secondary | ICD-10-CM | POA: Diagnosis not present

## 2020-04-22 DIAGNOSIS — E063 Autoimmune thyroiditis: Secondary | ICD-10-CM | POA: Diagnosis not present

## 2020-04-25 DIAGNOSIS — I129 Hypertensive chronic kidney disease with stage 1 through stage 4 chronic kidney disease, or unspecified chronic kidney disease: Secondary | ICD-10-CM | POA: Diagnosis not present

## 2020-04-25 DIAGNOSIS — E1122 Type 2 diabetes mellitus with diabetic chronic kidney disease: Secondary | ICD-10-CM | POA: Diagnosis not present

## 2020-04-25 DIAGNOSIS — E7849 Other hyperlipidemia: Secondary | ICD-10-CM | POA: Diagnosis not present

## 2020-04-25 DIAGNOSIS — N1832 Chronic kidney disease, stage 3b: Secondary | ICD-10-CM | POA: Diagnosis not present

## 2020-05-08 ENCOUNTER — Other Ambulatory Visit: Payer: Self-pay

## 2020-05-08 ENCOUNTER — Encounter (HOSPITAL_COMMUNITY): Payer: Self-pay | Admitting: *Deleted

## 2020-05-08 ENCOUNTER — Emergency Department (HOSPITAL_COMMUNITY)
Admission: EM | Admit: 2020-05-08 | Discharge: 2020-05-08 | Disposition: A | Discharge status: Home / Self Care | Payer: Medicare Other | Attending: Emergency Medicine | Admitting: Emergency Medicine

## 2020-05-08 ENCOUNTER — Emergency Department (HOSPITAL_COMMUNITY): Payer: Medicare Other

## 2020-05-08 DIAGNOSIS — R1013 Epigastric pain: Secondary | ICD-10-CM

## 2020-05-08 DIAGNOSIS — Z79899 Other long term (current) drug therapy: Secondary | ICD-10-CM | POA: Diagnosis not present

## 2020-05-08 DIAGNOSIS — E119 Type 2 diabetes mellitus without complications: Secondary | ICD-10-CM | POA: Diagnosis not present

## 2020-05-08 DIAGNOSIS — R079 Chest pain, unspecified: Secondary | ICD-10-CM | POA: Diagnosis not present

## 2020-05-08 DIAGNOSIS — Z7984 Long term (current) use of oral hypoglycemic drugs: Secondary | ICD-10-CM | POA: Insufficient documentation

## 2020-05-08 DIAGNOSIS — I10 Essential (primary) hypertension: Secondary | ICD-10-CM | POA: Diagnosis not present

## 2020-05-08 HISTORY — DX: Pure hypercholesterolemia, unspecified: E78.00

## 2020-05-08 HISTORY — DX: Disorder of thyroid, unspecified: E07.9

## 2020-05-08 HISTORY — DX: Type 2 diabetes mellitus without complications: E11.9

## 2020-05-08 LAB — CBC WITH DIFFERENTIAL/PLATELET
Abs Immature Granulocytes: 0.06 10*3/uL (ref 0.00–0.07)
Basophils Absolute: 0.1 10*3/uL (ref 0.0–0.1)
Basophils Relative: 1 %
Eosinophils Absolute: 0.1 10*3/uL (ref 0.0–0.5)
Eosinophils Relative: 1 %
HCT: 37.4 % (ref 36.0–46.0)
Hemoglobin: 13 g/dL (ref 12.0–15.0)
Immature Granulocytes: 1 %
Lymphocytes Relative: 11 %
Lymphs Abs: 1.4 10*3/uL (ref 0.7–4.0)
MCH: 34.3 pg — ABNORMAL HIGH (ref 26.0–34.0)
MCHC: 34.8 g/dL (ref 30.0–36.0)
MCV: 98.7 fL (ref 80.0–100.0)
Monocytes Absolute: 0.7 10*3/uL (ref 0.1–1.0)
Monocytes Relative: 6 %
Neutro Abs: 10.3 10*3/uL — ABNORMAL HIGH (ref 1.7–7.7)
Neutrophils Relative %: 80 %
Platelets: 228 10*3/uL (ref 150–400)
RBC: 3.79 MIL/uL — ABNORMAL LOW (ref 3.87–5.11)
RDW: 13 % (ref 11.5–15.5)
WBC: 12.6 10*3/uL — ABNORMAL HIGH (ref 4.0–10.5)
nRBC: 0 % (ref 0.0–0.2)

## 2020-05-08 LAB — COMPREHENSIVE METABOLIC PANEL
ALT: 45 U/L — ABNORMAL HIGH (ref 0–44)
AST: 61 U/L — ABNORMAL HIGH (ref 15–41)
Albumin: 4 g/dL (ref 3.5–5.0)
Alkaline Phosphatase: 84 U/L (ref 38–126)
Anion gap: 9 (ref 5–15)
BUN: 28 mg/dL — ABNORMAL HIGH (ref 8–23)
CO2: 27 mmol/L (ref 22–32)
Calcium: 9 mg/dL (ref 8.9–10.3)
Chloride: 102 mmol/L (ref 98–111)
Creatinine, Ser: 1.19 mg/dL — ABNORMAL HIGH (ref 0.44–1.00)
GFR calc Af Amer: 48 mL/min — ABNORMAL LOW (ref >60)
GFR calc non Af Amer: 41 mL/min — ABNORMAL LOW (ref >60)
Glucose, Bld: 162 mg/dL — ABNORMAL HIGH (ref 70–99)
Potassium: 3.8 mmol/L (ref 3.5–5.1)
Sodium: 138 mmol/L (ref 135–145)
Total Bilirubin: 1.1 mg/dL (ref 0.3–1.2)
Total Protein: 7.1 g/dL (ref 6.5–8.1)

## 2020-05-08 LAB — TROPONIN I (HIGH SENSITIVITY)
Troponin I (High Sensitivity): 4 ng/L (ref <18)
Troponin I (High Sensitivity): 5 ng/L (ref <18)

## 2020-05-08 MED ORDER — PANTOPRAZOLE SODIUM 40 MG IV SOLR
40.0000 mg | Freq: Once | INTRAVENOUS | Status: AC
  Administered 2020-05-08: 40 mg via INTRAVENOUS
  Filled 2020-05-08: qty 40

## 2020-05-08 MED ORDER — PANTOPRAZOLE SODIUM 20 MG PO TBEC: 20.0000 mg | DELAYED_RELEASE_TABLET | Freq: Every day | ORAL | 0 refills | Status: AC

## 2020-05-08 MED ORDER — SODIUM CHLORIDE 0.9 % IV BOLUS
500.0000 mL | Freq: Once | INTRAVENOUS | Status: AC
  Administered 2020-05-08: 500 mL via INTRAVENOUS

## 2020-05-08 NOTE — ED Provider Notes (Signed)
[facility] EMERGENCY DEPARTMENT Provider Note   CSN: [id number] Arrival date & time: 05/08/20  1640     History Chief Complaint  Patient presents with  . Abdominal Pain    Melissa Kane is a 86 y.o. female.  Patient states he had some epigastric pain which is improved.  The history is provided by the patient. No language interpreter was used.  Abdominal Pain Pain location:  Epigastric Pain quality: aching   Pain radiates to:  Epigastric region Pain severity:  Moderate Onset quality:  Sudden Timing:  Constant Progression:  Improving Chronicity:  New Context: not alcohol use   Relieved by:  Nothing Associated symptoms: no chest pain, no cough, no diarrhea, no fatigue and no hematuria        Past Medical History:  Diagnosis Date  . Diabetes mellitus without complication ([place])   . Essential hypertension, benign   . Hypercholesteremia   . Hyperlipidemia   . Impaired glucose tolerance   . Thyroid disease     Patient Active Problem List   Diagnosis Date Noted  . Shortness of breath 03/14/2012  . Abnormal EKG 03/14/2012  . Carotid bruit 03/14/2012  . Essential hypertension, benign 03/14/2012  . Mixed hyperlipidemia 03/14/2012  . Impaired glucose tolerance 03/14/2012    Past Surgical History:  Procedure Laterality Date  . APPENDECTOMY    . COLONOSCOPY     Patient states she had in [place]/Dr.Rehman greater than 6 years,  . COLONOSCOPY N/A 06/18/2015   Procedure: COLONOSCOPY;  Surgeon: Rogene Houston, MD;  Location: [facility];  Service: Endoscopy;  Laterality: N/A;  830 -- in OR under fluoro  . Excision left breast cyst    . TUBAL LIGATION       OB History   No obstetric history on file.     Family History  Problem Relation Age of Onset  . Cancer Father        Leukemia  . Cancer Sister        Pancreatic  . Cancer Sister        Colon  . COPD Sister     Social History   Tobacco Use  . Smoking status: Former Smoker    Types: Cigarettes  .  Smokeless tobacco: Never Used  Substance Use Topics  . Alcohol use: No    Alcohol/week: 0.0 standard drinks  . Drug use: No    Home Medications Prior to Admission medications   Medication Sig Start Date End Date Taking? Authorizing Provider  atorvastatin (LIPITOR) 80 MG tablet Take 80 mg by mouth daily.   Yes [provider]  Bisacodyl (WOMENS GENTLE LAXATIVE PO) Take 1 tablet by mouth at bedtime as needed.   Yes [provider]  chlorpheniramine (CHLOR-TRIMETON) 4 MG tablet Take 1 tablet by mouth every evening. 07/25/17  Yes [provider]  Coenzyme Q10 50 MG CAPS Take 1 capsule by mouth daily. 07/25/17  Yes [provider]  diphenhydrAMINE (BENADRYL) 25 MG tablet Take 25 mg by mouth at bedtime.    Yes [provider]  Ergocalciferol 50 MCG (2000 UT) CAPS Take 1 capsule by mouth daily.   Yes [provider]  glimepiride (AMARYL) 1 MG tablet Take 1 mg by mouth daily. 04/02/20  Yes [provider]  levothyroxine (SYNTHROID) 25 MCG tablet Take 25 mcg by mouth daily. 05/07/20  Yes [provider]  losartan-hydrochlorothiazide (HYZAAR) 50-12.5 MG per tablet Take 1 tablet by mouth daily.    Yes [provider]  Turmeric 500 MG CAPS Take 1 capsule by mouth daily.   Yes [provider]  pantoprazole (PROTONIX) 20 MG tablet Take 1 tablet (20 mg total) by mouth daily. 05/08/20   Milton Ferguson, MD    Allergies    Patient has no known allergies.  Review of Systems   Review of Systems  Constitutional: Negative for appetite change and fatigue.  HENT: Negative for congestion, ear discharge and sinus pressure.   Eyes: Negative for discharge.  Respiratory: Negative for cough.   Cardiovascular: Negative for chest pain.  Gastrointestinal: Positive for abdominal pain. Negative for diarrhea.  Genitourinary: Negative for frequency and hematuria.  Musculoskeletal: Negative for back pain.  Skin: Negative for rash.    Neurological: Negative for seizures and headaches.  Psychiatric/Behavioral: Negative for hallucinations.    Physical Exam Updated Vital Signs BP 137/66   Pulse 64   Temp 97.8 F (36.6 C) (Oral)   Resp (!) 24   Ht 5\' 5"  (1.651 m)   Wt 78.5 kg   SpO2 98%   BMI 28.79 kg/m   Physical Exam Vitals and nursing note reviewed.  Constitutional:      Appearance: She is well-developed.  HENT:     Head: Normocephalic.     Nose: Nose normal.  Eyes:     General: No scleral icterus.    Conjunctiva/sclera: Conjunctivae normal.  Neck:     Thyroid: No thyromegaly.  Cardiovascular:     Rate and Rhythm: Normal rate and regular rhythm.     Heart sounds: No murmur. No friction rub. No gallop.   Pulmonary:     Breath sounds: No stridor. No wheezing or rales.  Chest:     Chest wall: No tenderness.  Abdominal:     General: There is no distension.     Tenderness: There is abdominal tenderness. There is no rebound.  Musculoskeletal:        General: Normal range of motion.     Cervical back: Neck supple.  Lymphadenopathy:     Cervical: No cervical adenopathy.  Skin:    Findings: No erythema or rash.  Neurological:     Mental Status: She is alert and oriented to person, place, and time.     Motor: No abnormal muscle tone.     Coordination: Coordination normal.  Psychiatric:        Behavior: Behavior normal.     ED Results / Procedures / Treatments   Labs (all labs ordered are listed, but only abnormal results are displayed) Labs Reviewed  CBC WITH DIFFERENTIAL/PLATELET - Abnormal; Notable for the following components:      Result Value   WBC 12.6 (*)    RBC 3.79 (*)    MCH 34.3 (*)    Neutro Abs 10.3 (*)    All other components within normal limits  COMPREHENSIVE METABOLIC PANEL - Abnormal; Notable for the following components:   Glucose, Bld 162 (*)    BUN 28 (*)    Creatinine, Ser 1.19 (*)    AST 61 (*)    ALT 45 (*)    GFR calc non Af Amer 41 (*)    GFR calc Af Amer 48  (*)    All other components within normal limits  TROPONIN I (HIGH SENSITIVITY)  TROPONIN I (HIGH SENSITIVITY)    EKG EKG Interpretation  Date/Time:  Thursday May 08 2020 18:16:56 EDT Ventricular Rate:  65 PR Interval:    QRS Duration: 83 QT Interval:  375 QTC Calculation:  390 R Axis:   54 Text Interpretation: Sinus rhythm Nonspecific repol abnormality, diffuse leads Confirmed by Milton Ferguson [phone]) on 05/08/2020 9:31:46 PM   Radiology DG Chest Port 1 View  Result Date: 05/08/2020 CLINICAL DATA:  Upper abdominal pain, lower chest pain EXAM: PORTABLE CHEST 1 VIEW COMPARISON:  None. FINDINGS: Heart is normal size. Increased markings in the lung bases, likely scarring. No confluent opacities or effusions or edema. Aortic atherosclerosis. No acute bony abnormality. IMPRESSION: Increased markings at the lung bases, likely scarring. No active disease. Electronically Signed   By: Rolm Baptise M.D.   On: 05/08/2020 18:27    Procedures Procedures (including critical care time)  Medications Ordered in ED Medications  pantoprazole (PROTONIX) injection 40 mg (40 mg Intravenous Given 05/08/20 2108)  sodium chloride 0.9 % bolus 500 mL (500 mLs Intravenous New Bag/Given 05/08/20 2108)    ED Course  I have reviewed the triage vital signs and the nursing notes.  Pertinent labs & imaging results that were available during my care of the patient were reviewed by me and considered in my medical decision making (see chart for details).    MDM Rules/Calculators/A&P                      Epigastric pain.  Labs unremarkable including troponin twice chemistries and CBC.  Patient has improved.  She will be sent home on Protonix and will follow up with her PCP     This patient presents to the ED for concern of epigastric pain this involves an extensive number of treatment options, and is a complaint that carries with it a high risk of complications and morbidity.  The differential diagnosis  includes gastritis MI   Lab Tests:   I Ordered, reviewed, and interpreted labs, which included CBC chemistries troponins all unremarkable  Medicines ordered:   I ordered medication protonic for epigastric pain  Imaging Studies ordered:   I ordered imaging studies which included chest x-ray and  I independently visualized and interpreted imaging which showed unremarkable  Additional history obtained:   Additional history obtained from relative  Previous records obtained and reviewed   Consultations Obtained:   Reevaluation:  After the interventions stated above, I reevaluated the patient and found improved  Critical Interventions:  .   Final Clinical Impression(s) / ED Diagnoses Final diagnoses:  Epigastric pain    Rx / DC Orders ED Discharge Orders         Ordered    pantoprazole (PROTONIX) 20 MG tablet  Daily     05/08/20 2139           Milton Ferguson, MD 05/08/20 2146

## 2020-05-08 NOTE — ED Triage Notes (Signed)
Pt states she picked up a 40 lb bag of sunflower seeds and is now having upper abdominal pain

## 2020-05-08 NOTE — Discharge Instructions (Addendum)
Follow-up with your doctor next week or return here sooner if any problems

## 2020-05-26 DIAGNOSIS — E1122 Type 2 diabetes mellitus with diabetic chronic kidney disease: Secondary | ICD-10-CM | POA: Diagnosis not present

## 2020-05-26 DIAGNOSIS — E7849 Other hyperlipidemia: Secondary | ICD-10-CM | POA: Diagnosis not present

## 2020-05-26 DIAGNOSIS — I129 Hypertensive chronic kidney disease with stage 1 through stage 4 chronic kidney disease, or unspecified chronic kidney disease: Secondary | ICD-10-CM | POA: Diagnosis not present

## 2020-05-26 DIAGNOSIS — N1832 Chronic kidney disease, stage 3b: Secondary | ICD-10-CM | POA: Diagnosis not present

## 2020-06-25 DIAGNOSIS — E1122 Type 2 diabetes mellitus with diabetic chronic kidney disease: Secondary | ICD-10-CM | POA: Diagnosis not present

## 2020-06-25 DIAGNOSIS — N1832 Chronic kidney disease, stage 3b: Secondary | ICD-10-CM | POA: Diagnosis not present

## 2020-06-25 DIAGNOSIS — E7849 Other hyperlipidemia: Secondary | ICD-10-CM | POA: Diagnosis not present

## 2020-06-25 DIAGNOSIS — I129 Hypertensive chronic kidney disease with stage 1 through stage 4 chronic kidney disease, or unspecified chronic kidney disease: Secondary | ICD-10-CM | POA: Diagnosis not present

## 2020-07-08 DIAGNOSIS — Z79899 Other long term (current) drug therapy: Secondary | ICD-10-CM | POA: Diagnosis not present

## 2020-07-08 DIAGNOSIS — D631 Anemia in chronic kidney disease: Secondary | ICD-10-CM | POA: Diagnosis not present

## 2020-07-08 DIAGNOSIS — I10 Essential (primary) hypertension: Secondary | ICD-10-CM | POA: Diagnosis not present

## 2020-07-08 DIAGNOSIS — N1831 Chronic kidney disease, stage 3a: Secondary | ICD-10-CM | POA: Diagnosis not present

## 2020-07-08 DIAGNOSIS — R809 Proteinuria, unspecified: Secondary | ICD-10-CM | POA: Diagnosis not present

## 2020-07-09 DIAGNOSIS — Z1231 Encounter for screening mammogram for malignant neoplasm of breast: Secondary | ICD-10-CM | POA: Diagnosis not present

## 2020-07-10 DIAGNOSIS — Z961 Presence of intraocular lens: Secondary | ICD-10-CM | POA: Diagnosis not present

## 2020-07-10 DIAGNOSIS — E119 Type 2 diabetes mellitus without complications: Secondary | ICD-10-CM | POA: Diagnosis not present

## 2020-07-10 DIAGNOSIS — H26493 Other secondary cataract, bilateral: Secondary | ICD-10-CM | POA: Diagnosis not present

## 2020-07-11 ENCOUNTER — Other Ambulatory Visit (HOSPITAL_COMMUNITY): Payer: Self-pay | Admitting: Nephrology

## 2020-07-11 ENCOUNTER — Other Ambulatory Visit: Payer: Self-pay | Admitting: Nephrology

## 2020-07-11 DIAGNOSIS — I129 Hypertensive chronic kidney disease with stage 1 through stage 4 chronic kidney disease, or unspecified chronic kidney disease: Secondary | ICD-10-CM | POA: Diagnosis not present

## 2020-07-11 DIAGNOSIS — Z79899 Other long term (current) drug therapy: Secondary | ICD-10-CM | POA: Diagnosis not present

## 2020-07-11 DIAGNOSIS — N189 Chronic kidney disease, unspecified: Secondary | ICD-10-CM | POA: Diagnosis not present

## 2020-07-11 DIAGNOSIS — E559 Vitamin D deficiency, unspecified: Secondary | ICD-10-CM | POA: Diagnosis not present

## 2020-07-11 DIAGNOSIS — E1122 Type 2 diabetes mellitus with diabetic chronic kidney disease: Secondary | ICD-10-CM

## 2020-07-11 DIAGNOSIS — N170 Acute kidney failure with tubular necrosis: Secondary | ICD-10-CM | POA: Diagnosis not present

## 2020-07-18 ENCOUNTER — Other Ambulatory Visit: Payer: Self-pay

## 2020-07-18 ENCOUNTER — Ambulatory Visit (HOSPITAL_COMMUNITY)
Admission: RE | Admit: 2020-07-18 | Discharge: 2020-07-18 | Disposition: A | Discharge status: Home / Self Care | Payer: Medicare Other | Source: Ambulatory Visit | Attending: Nephrology | Admitting: Nephrology

## 2020-07-18 DIAGNOSIS — N170 Acute kidney failure with tubular necrosis: Secondary | ICD-10-CM | POA: Diagnosis not present

## 2020-07-18 DIAGNOSIS — E1122 Type 2 diabetes mellitus with diabetic chronic kidney disease: Secondary | ICD-10-CM | POA: Diagnosis not present

## 2020-07-18 DIAGNOSIS — N281 Cyst of kidney, acquired: Secondary | ICD-10-CM | POA: Diagnosis not present

## 2020-07-18 DIAGNOSIS — Q600 Renal agenesis, unilateral: Secondary | ICD-10-CM | POA: Diagnosis not present

## 2020-07-21 DIAGNOSIS — H26492 Other secondary cataract, left eye: Secondary | ICD-10-CM | POA: Diagnosis not present

## 2020-07-21 DIAGNOSIS — Z961 Presence of intraocular lens: Secondary | ICD-10-CM | POA: Diagnosis not present

## 2020-07-22 DIAGNOSIS — I10 Essential (primary) hypertension: Secondary | ICD-10-CM | POA: Diagnosis not present

## 2020-07-22 DIAGNOSIS — R7309 Other abnormal glucose: Secondary | ICD-10-CM | POA: Diagnosis not present

## 2020-07-22 DIAGNOSIS — R12 Heartburn: Secondary | ICD-10-CM | POA: Diagnosis not present

## 2020-07-29 DIAGNOSIS — N189 Chronic kidney disease, unspecified: Secondary | ICD-10-CM | POA: Diagnosis not present

## 2020-07-29 DIAGNOSIS — I129 Hypertensive chronic kidney disease with stage 1 through stage 4 chronic kidney disease, or unspecified chronic kidney disease: Secondary | ICD-10-CM | POA: Diagnosis not present

## 2020-07-29 DIAGNOSIS — E559 Vitamin D deficiency, unspecified: Secondary | ICD-10-CM | POA: Diagnosis not present

## 2020-07-29 DIAGNOSIS — E1122 Type 2 diabetes mellitus with diabetic chronic kidney disease: Secondary | ICD-10-CM | POA: Diagnosis not present

## 2020-07-29 DIAGNOSIS — N170 Acute kidney failure with tubular necrosis: Secondary | ICD-10-CM | POA: Diagnosis not present

## 2020-08-01 DIAGNOSIS — N170 Acute kidney failure with tubular necrosis: Secondary | ICD-10-CM | POA: Diagnosis not present

## 2020-08-01 DIAGNOSIS — N189 Chronic kidney disease, unspecified: Secondary | ICD-10-CM | POA: Diagnosis not present

## 2020-08-01 DIAGNOSIS — E559 Vitamin D deficiency, unspecified: Secondary | ICD-10-CM | POA: Diagnosis not present

## 2020-08-01 DIAGNOSIS — E1122 Type 2 diabetes mellitus with diabetic chronic kidney disease: Secondary | ICD-10-CM | POA: Diagnosis not present

## 2020-08-01 DIAGNOSIS — I129 Hypertensive chronic kidney disease with stage 1 through stage 4 chronic kidney disease, or unspecified chronic kidney disease: Secondary | ICD-10-CM | POA: Diagnosis not present

## 2020-09-25 DIAGNOSIS — I129 Hypertensive chronic kidney disease with stage 1 through stage 4 chronic kidney disease, or unspecified chronic kidney disease: Secondary | ICD-10-CM | POA: Diagnosis not present

## 2020-09-25 DIAGNOSIS — N1832 Chronic kidney disease, stage 3b: Secondary | ICD-10-CM | POA: Diagnosis not present

## 2020-09-25 DIAGNOSIS — E1122 Type 2 diabetes mellitus with diabetic chronic kidney disease: Secondary | ICD-10-CM | POA: Diagnosis not present

## 2020-09-25 DIAGNOSIS — E7849 Other hyperlipidemia: Secondary | ICD-10-CM | POA: Diagnosis not present

## 2020-10-06 DIAGNOSIS — N170 Acute kidney failure with tubular necrosis: Secondary | ICD-10-CM | POA: Diagnosis not present

## 2020-10-06 DIAGNOSIS — E559 Vitamin D deficiency, unspecified: Secondary | ICD-10-CM | POA: Diagnosis not present

## 2020-10-06 DIAGNOSIS — I129 Hypertensive chronic kidney disease with stage 1 through stage 4 chronic kidney disease, or unspecified chronic kidney disease: Secondary | ICD-10-CM | POA: Diagnosis not present

## 2020-10-06 DIAGNOSIS — N189 Chronic kidney disease, unspecified: Secondary | ICD-10-CM | POA: Diagnosis not present

## 2020-10-06 DIAGNOSIS — E1122 Type 2 diabetes mellitus with diabetic chronic kidney disease: Secondary | ICD-10-CM | POA: Diagnosis not present

## 2020-10-06 DIAGNOSIS — Z23 Encounter for immunization: Secondary | ICD-10-CM | POA: Diagnosis not present

## 2020-10-10 DIAGNOSIS — I129 Hypertensive chronic kidney disease with stage 1 through stage 4 chronic kidney disease, or unspecified chronic kidney disease: Secondary | ICD-10-CM | POA: Diagnosis not present

## 2020-10-10 DIAGNOSIS — K219 Gastro-esophageal reflux disease without esophagitis: Secondary | ICD-10-CM | POA: Diagnosis not present

## 2020-10-10 DIAGNOSIS — N189 Chronic kidney disease, unspecified: Secondary | ICD-10-CM | POA: Diagnosis not present

## 2020-10-10 DIAGNOSIS — E1122 Type 2 diabetes mellitus with diabetic chronic kidney disease: Secondary | ICD-10-CM | POA: Diagnosis not present

## 2020-10-25 DIAGNOSIS — I129 Hypertensive chronic kidney disease with stage 1 through stage 4 chronic kidney disease, or unspecified chronic kidney disease: Secondary | ICD-10-CM | POA: Diagnosis not present

## 2020-10-25 DIAGNOSIS — E7849 Other hyperlipidemia: Secondary | ICD-10-CM | POA: Diagnosis not present

## 2020-10-25 DIAGNOSIS — E1122 Type 2 diabetes mellitus with diabetic chronic kidney disease: Secondary | ICD-10-CM | POA: Diagnosis not present

## 2020-10-25 DIAGNOSIS — N1832 Chronic kidney disease, stage 3b: Secondary | ICD-10-CM | POA: Diagnosis not present

## 2020-10-27 DIAGNOSIS — M1712 Unilateral primary osteoarthritis, left knee: Secondary | ICD-10-CM | POA: Diagnosis not present

## 2020-12-18 DIAGNOSIS — R201 Hypoesthesia of skin: Secondary | ICD-10-CM | POA: Diagnosis not present

## 2020-12-18 DIAGNOSIS — E1129 Type 2 diabetes mellitus with other diabetic kidney complication: Secondary | ICD-10-CM | POA: Diagnosis not present

## 2020-12-18 DIAGNOSIS — I10 Essential (primary) hypertension: Secondary | ICD-10-CM | POA: Diagnosis not present

## 2020-12-18 DIAGNOSIS — L84 Corns and callosities: Secondary | ICD-10-CM | POA: Diagnosis not present

## 2020-12-18 DIAGNOSIS — N182 Chronic kidney disease, stage 2 (mild): Secondary | ICD-10-CM | POA: Diagnosis not present

## 2020-12-26 DIAGNOSIS — E7849 Other hyperlipidemia: Secondary | ICD-10-CM | POA: Diagnosis not present

## 2020-12-26 DIAGNOSIS — N1832 Chronic kidney disease, stage 3b: Secondary | ICD-10-CM | POA: Diagnosis not present

## 2020-12-26 DIAGNOSIS — E1122 Type 2 diabetes mellitus with diabetic chronic kidney disease: Secondary | ICD-10-CM | POA: Diagnosis not present

## 2020-12-26 DIAGNOSIS — I129 Hypertensive chronic kidney disease with stage 1 through stage 4 chronic kidney disease, or unspecified chronic kidney disease: Secondary | ICD-10-CM | POA: Diagnosis not present

## 2021-01-01 DIAGNOSIS — Z1283 Encounter for screening for malignant neoplasm of skin: Secondary | ICD-10-CM | POA: Diagnosis not present

## 2021-01-01 DIAGNOSIS — X32XXXD Exposure to sunlight, subsequent encounter: Secondary | ICD-10-CM | POA: Diagnosis not present

## 2021-01-01 DIAGNOSIS — D225 Melanocytic nevi of trunk: Secondary | ICD-10-CM | POA: Diagnosis not present

## 2021-01-01 DIAGNOSIS — B078 Other viral warts: Secondary | ICD-10-CM | POA: Diagnosis not present

## 2021-01-01 DIAGNOSIS — L570 Actinic keratosis: Secondary | ICD-10-CM | POA: Diagnosis not present

## 2021-01-01 DIAGNOSIS — L821 Other seborrheic keratosis: Secondary | ICD-10-CM | POA: Diagnosis not present

## 2021-01-07 DIAGNOSIS — K219 Gastro-esophageal reflux disease without esophagitis: Secondary | ICD-10-CM | POA: Diagnosis not present

## 2021-01-07 DIAGNOSIS — N189 Chronic kidney disease, unspecified: Secondary | ICD-10-CM | POA: Diagnosis not present

## 2021-01-07 DIAGNOSIS — E1122 Type 2 diabetes mellitus with diabetic chronic kidney disease: Secondary | ICD-10-CM | POA: Diagnosis not present

## 2021-01-07 DIAGNOSIS — I129 Hypertensive chronic kidney disease with stage 1 through stage 4 chronic kidney disease, or unspecified chronic kidney disease: Secondary | ICD-10-CM | POA: Diagnosis not present

## 2021-01-14 DIAGNOSIS — E1122 Type 2 diabetes mellitus with diabetic chronic kidney disease: Secondary | ICD-10-CM | POA: Diagnosis not present

## 2021-01-14 DIAGNOSIS — I129 Hypertensive chronic kidney disease with stage 1 through stage 4 chronic kidney disease, or unspecified chronic kidney disease: Secondary | ICD-10-CM | POA: Diagnosis not present

## 2021-01-14 DIAGNOSIS — N189 Chronic kidney disease, unspecified: Secondary | ICD-10-CM | POA: Diagnosis not present

## 2021-01-24 DIAGNOSIS — I129 Hypertensive chronic kidney disease with stage 1 through stage 4 chronic kidney disease, or unspecified chronic kidney disease: Secondary | ICD-10-CM | POA: Diagnosis not present

## 2021-01-24 DIAGNOSIS — E7849 Other hyperlipidemia: Secondary | ICD-10-CM | POA: Diagnosis not present

## 2021-01-24 DIAGNOSIS — N1832 Chronic kidney disease, stage 3b: Secondary | ICD-10-CM | POA: Diagnosis not present

## 2021-01-24 DIAGNOSIS — E1122 Type 2 diabetes mellitus with diabetic chronic kidney disease: Secondary | ICD-10-CM | POA: Diagnosis not present

## 2021-02-05 DIAGNOSIS — L570 Actinic keratosis: Secondary | ICD-10-CM | POA: Diagnosis not present

## 2021-02-05 DIAGNOSIS — L304 Erythema intertrigo: Secondary | ICD-10-CM | POA: Diagnosis not present

## 2021-02-05 DIAGNOSIS — X32XXXD Exposure to sunlight, subsequent encounter: Secondary | ICD-10-CM | POA: Diagnosis not present

## 2021-03-25 DIAGNOSIS — N1832 Chronic kidney disease, stage 3b: Secondary | ICD-10-CM | POA: Diagnosis not present

## 2021-03-25 DIAGNOSIS — I129 Hypertensive chronic kidney disease with stage 1 through stage 4 chronic kidney disease, or unspecified chronic kidney disease: Secondary | ICD-10-CM | POA: Diagnosis not present

## 2021-03-25 DIAGNOSIS — E1122 Type 2 diabetes mellitus with diabetic chronic kidney disease: Secondary | ICD-10-CM | POA: Diagnosis not present

## 2021-03-25 DIAGNOSIS — E7849 Other hyperlipidemia: Secondary | ICD-10-CM | POA: Diagnosis not present

## 2021-04-09 DIAGNOSIS — E1122 Type 2 diabetes mellitus with diabetic chronic kidney disease: Secondary | ICD-10-CM | POA: Diagnosis not present

## 2021-04-09 DIAGNOSIS — I129 Hypertensive chronic kidney disease with stage 1 through stage 4 chronic kidney disease, or unspecified chronic kidney disease: Secondary | ICD-10-CM | POA: Diagnosis not present

## 2021-04-09 DIAGNOSIS — N189 Chronic kidney disease, unspecified: Secondary | ICD-10-CM | POA: Diagnosis not present

## 2021-04-17 DIAGNOSIS — N189 Chronic kidney disease, unspecified: Secondary | ICD-10-CM | POA: Diagnosis not present

## 2021-04-17 DIAGNOSIS — I129 Hypertensive chronic kidney disease with stage 1 through stage 4 chronic kidney disease, or unspecified chronic kidney disease: Secondary | ICD-10-CM | POA: Diagnosis not present

## 2021-04-17 DIAGNOSIS — E1122 Type 2 diabetes mellitus with diabetic chronic kidney disease: Secondary | ICD-10-CM | POA: Diagnosis not present

## 2021-05-19 DIAGNOSIS — E1129 Type 2 diabetes mellitus with other diabetic kidney complication: Secondary | ICD-10-CM | POA: Diagnosis not present

## 2021-05-19 DIAGNOSIS — E1165 Type 2 diabetes mellitus with hyperglycemia: Secondary | ICD-10-CM | POA: Diagnosis not present

## 2021-05-19 DIAGNOSIS — E1122 Type 2 diabetes mellitus with diabetic chronic kidney disease: Secondary | ICD-10-CM | POA: Diagnosis not present

## 2021-05-19 DIAGNOSIS — E7849 Other hyperlipidemia: Secondary | ICD-10-CM | POA: Diagnosis not present

## 2021-05-19 DIAGNOSIS — Z0000 Encounter for general adult medical examination without abnormal findings: Secondary | ICD-10-CM | POA: Diagnosis not present

## 2021-05-19 DIAGNOSIS — M1991 Primary osteoarthritis, unspecified site: Secondary | ICD-10-CM | POA: Diagnosis not present

## 2021-05-19 DIAGNOSIS — I10 Essential (primary) hypertension: Secondary | ICD-10-CM | POA: Diagnosis not present

## 2021-05-19 DIAGNOSIS — Z1389 Encounter for screening for other disorder: Secondary | ICD-10-CM | POA: Diagnosis not present

## 2021-06-25 DIAGNOSIS — E782 Mixed hyperlipidemia: Secondary | ICD-10-CM | POA: Diagnosis not present

## 2021-06-25 DIAGNOSIS — E1122 Type 2 diabetes mellitus with diabetic chronic kidney disease: Secondary | ICD-10-CM | POA: Diagnosis not present

## 2021-06-25 DIAGNOSIS — N1832 Chronic kidney disease, stage 3b: Secondary | ICD-10-CM | POA: Diagnosis not present

## 2021-06-25 DIAGNOSIS — I129 Hypertensive chronic kidney disease with stage 1 through stage 4 chronic kidney disease, or unspecified chronic kidney disease: Secondary | ICD-10-CM | POA: Diagnosis not present

## 2021-07-08 DIAGNOSIS — E1122 Type 2 diabetes mellitus with diabetic chronic kidney disease: Secondary | ICD-10-CM | POA: Diagnosis not present

## 2021-07-08 DIAGNOSIS — I129 Hypertensive chronic kidney disease with stage 1 through stage 4 chronic kidney disease, or unspecified chronic kidney disease: Secondary | ICD-10-CM | POA: Diagnosis not present

## 2021-07-08 DIAGNOSIS — N189 Chronic kidney disease, unspecified: Secondary | ICD-10-CM | POA: Diagnosis not present

## 2021-07-15 DIAGNOSIS — Z1231 Encounter for screening mammogram for malignant neoplasm of breast: Secondary | ICD-10-CM | POA: Diagnosis not present

## 2021-07-17 DIAGNOSIS — E1122 Type 2 diabetes mellitus with diabetic chronic kidney disease: Secondary | ICD-10-CM | POA: Diagnosis not present

## 2021-07-17 DIAGNOSIS — N189 Chronic kidney disease, unspecified: Secondary | ICD-10-CM | POA: Diagnosis not present

## 2021-07-17 DIAGNOSIS — I129 Hypertensive chronic kidney disease with stage 1 through stage 4 chronic kidney disease, or unspecified chronic kidney disease: Secondary | ICD-10-CM | POA: Diagnosis not present

## 2021-07-22 DIAGNOSIS — E119 Type 2 diabetes mellitus without complications: Secondary | ICD-10-CM | POA: Diagnosis not present

## 2021-07-22 DIAGNOSIS — Z961 Presence of intraocular lens: Secondary | ICD-10-CM | POA: Diagnosis not present

## 2021-10-13 DIAGNOSIS — Z23 Encounter for immunization: Secondary | ICD-10-CM | POA: Diagnosis not present

## 2021-11-06 DIAGNOSIS — E1122 Type 2 diabetes mellitus with diabetic chronic kidney disease: Secondary | ICD-10-CM | POA: Diagnosis not present

## 2021-11-06 DIAGNOSIS — N189 Chronic kidney disease, unspecified: Secondary | ICD-10-CM | POA: Diagnosis not present

## 2021-11-06 DIAGNOSIS — I129 Hypertensive chronic kidney disease with stage 1 through stage 4 chronic kidney disease, or unspecified chronic kidney disease: Secondary | ICD-10-CM | POA: Diagnosis not present

## 2021-11-13 DIAGNOSIS — R718 Other abnormality of red blood cells: Secondary | ICD-10-CM | POA: Diagnosis not present

## 2021-11-13 DIAGNOSIS — N189 Chronic kidney disease, unspecified: Secondary | ICD-10-CM | POA: Diagnosis not present

## 2021-11-13 DIAGNOSIS — E1122 Type 2 diabetes mellitus with diabetic chronic kidney disease: Secondary | ICD-10-CM | POA: Diagnosis not present

## 2021-11-13 DIAGNOSIS — I129 Hypertensive chronic kidney disease with stage 1 through stage 4 chronic kidney disease, or unspecified chronic kidney disease: Secondary | ICD-10-CM | POA: Diagnosis not present

## 2021-11-26 ENCOUNTER — Ambulatory Visit (INDEPENDENT_AMBULATORY_CARE_PROVIDER_SITE_OTHER): Payer: Self-pay | Admitting: Family Medicine

## 2021-12-10 ENCOUNTER — Ambulatory Visit (INDEPENDENT_AMBULATORY_CARE_PROVIDER_SITE_OTHER): Payer: Self-pay | Admitting: Family Medicine

## 2022-01-08 IMAGING — US US RENAL
1 series · 14 of 25 positions shown · non-contrast
Comparison: None

CLINICAL DATA: Acute kidney failure with tubular necrosis, history
hypertension, diabetes mellitus, former smoker

EXAM:
RENAL / URINARY TRACT ULTRASOUND COMPLETE

[Series 1: us renal · 14 of 53 slices shown]
[im 1/53]
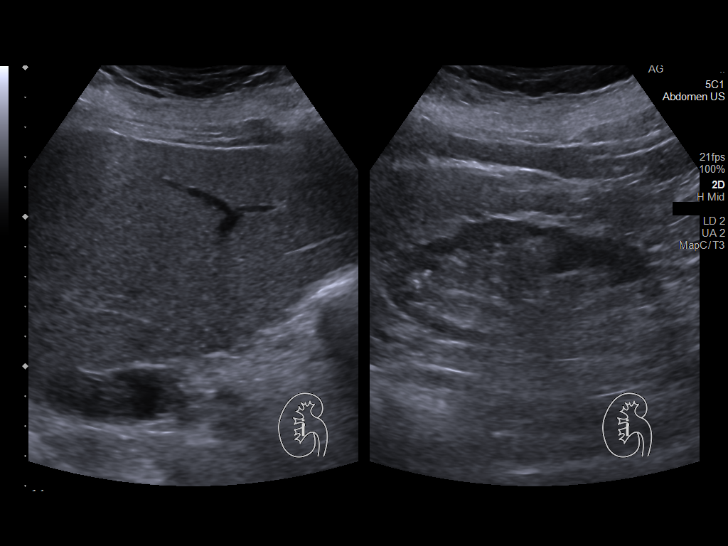
[im 5/53]
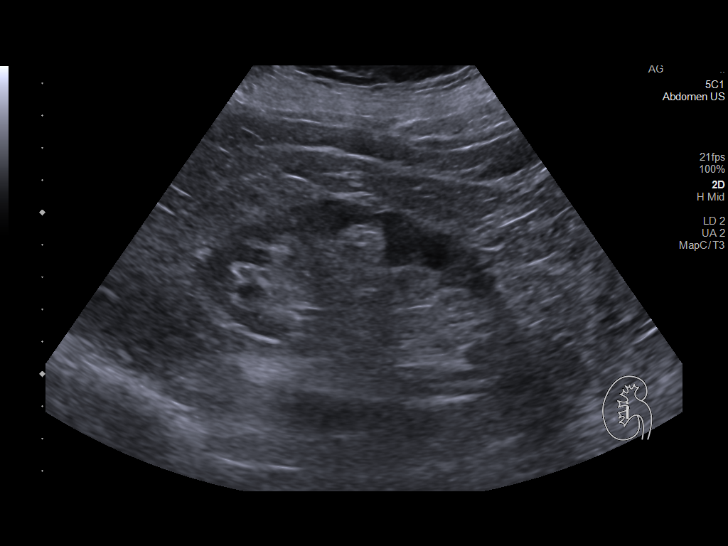
[im 9/53]
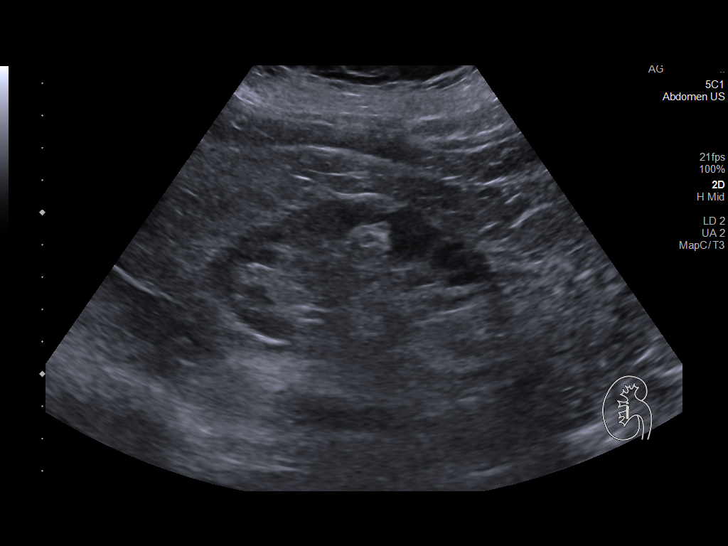
[im 14/53]
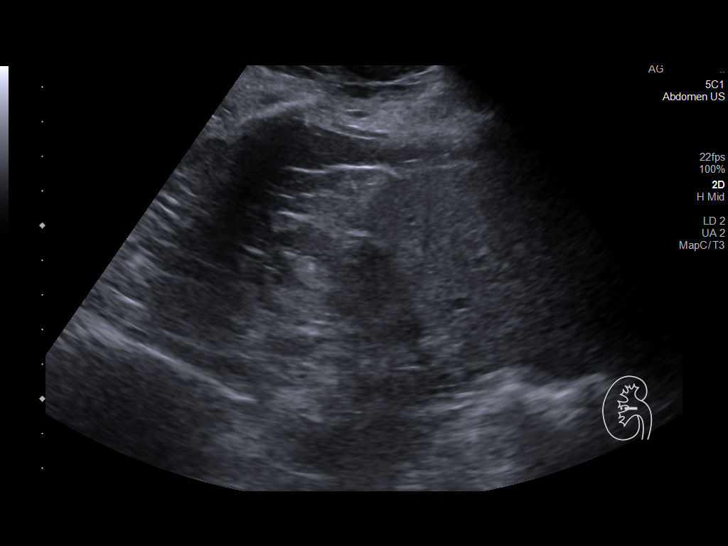
[im 18/53]
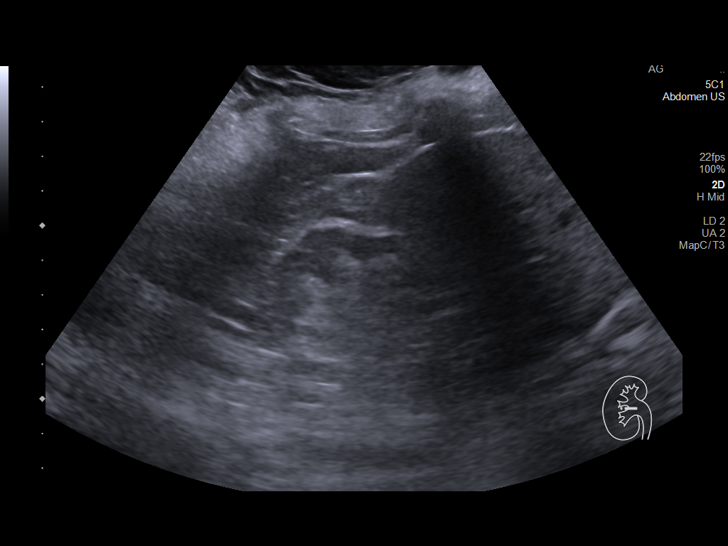
[im 20/53]
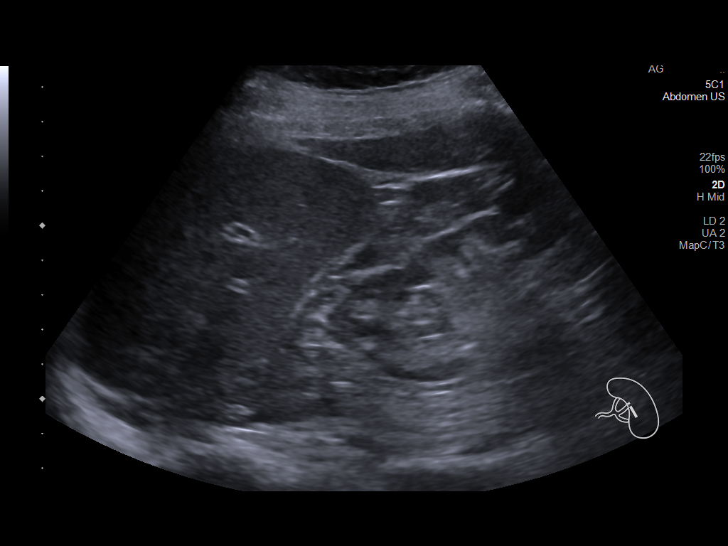
[im 24/53]
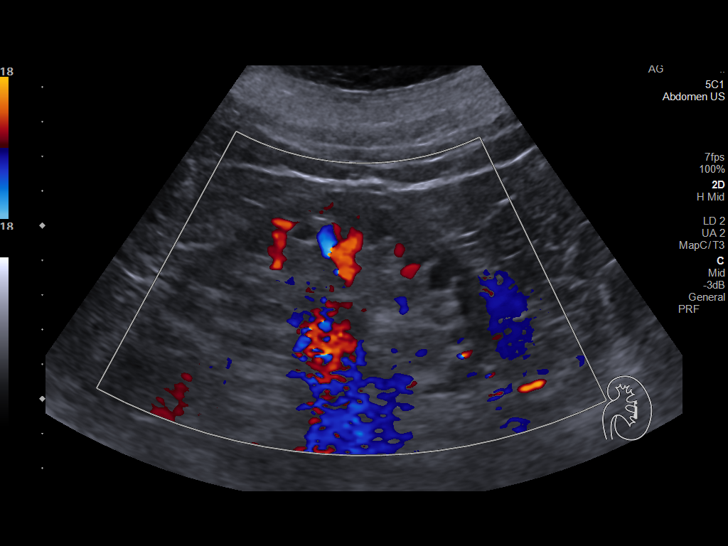
[im 29/53]
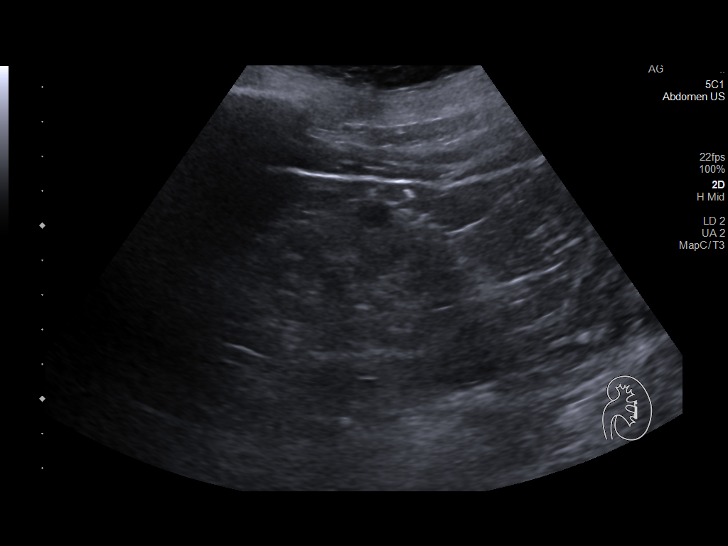
[im 33/53]
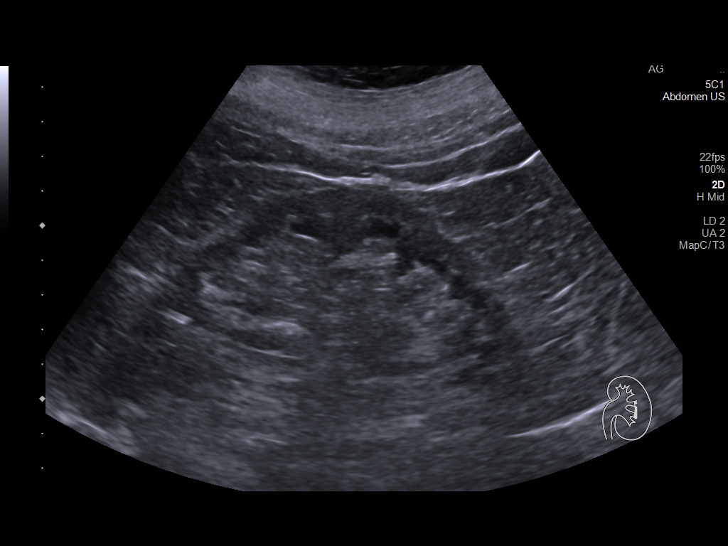
[im 35/53]
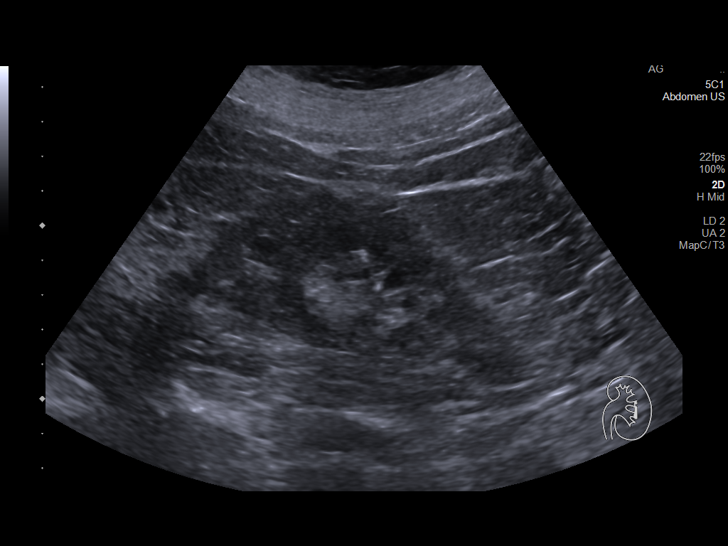
[im 40/53]
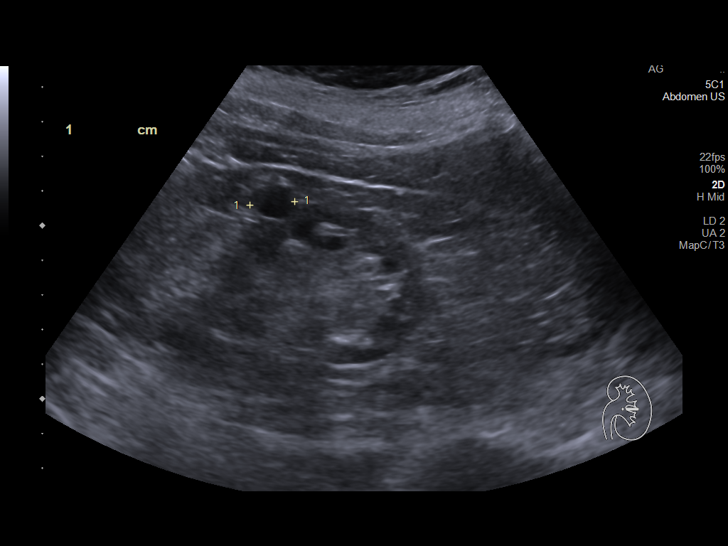
[im 44/53]
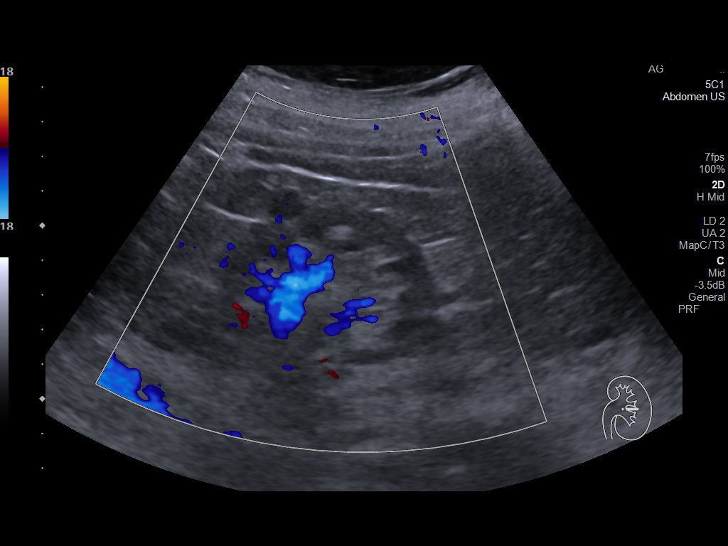
[im 48/53]
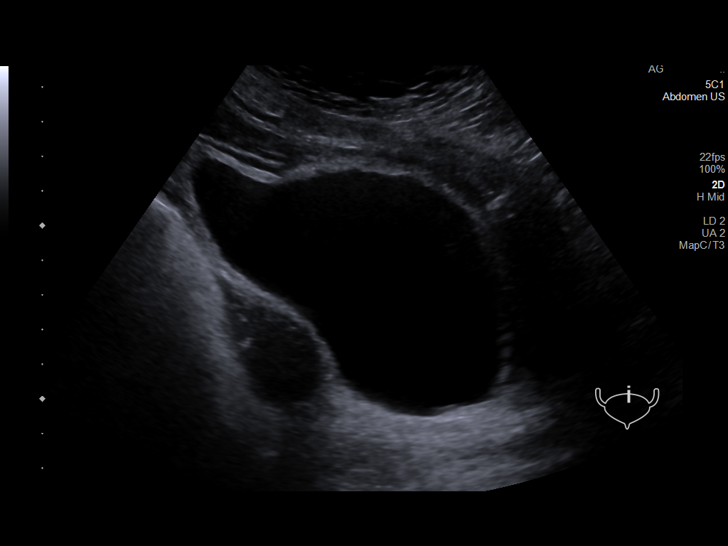
[im 53/53]
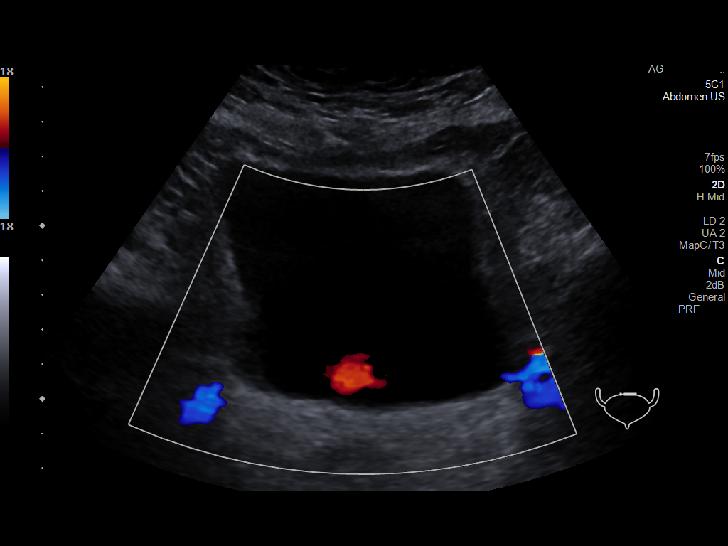

[14 of 25 positions shown; findings below may reference images not displayed]

FINDINGS: Right Kidney:

Renal measurements: 10.2 x 4.9 x 4.2 cm = volume: 109 mL. Cortical
thinning. Minimally increased cortical echogenicity. No mass,
hydronephrosis, or shadowing calcification.

Left Kidney:

Renal measurements: 9.6 x 5.3 x 5.0 cm = volume: 133 mL. Cortical
thinning. Minimally increased cortical echogenicity. Tiny exophytic
cyst at mid kidney 11 x 9 x 13 mm. No additional mass or
hydronephrosis. No shadowing calcification.

Bladder:

Appears normal for degree of bladder distention. BILATERAL ureteral
jets visualized.

Other:

N/A
IMPRESSION: Cortical thinning and probable medical renal disease changes of both
kidneys.

Tiny LEFT renal cyst 13 mm diameter.

No additional renal sonographic abnormalities.

## 2022-01-14 DIAGNOSIS — L820 Inflamed seborrheic keratosis: Secondary | ICD-10-CM | POA: Diagnosis not present

## 2022-01-14 DIAGNOSIS — L720 Epidermal cyst: Secondary | ICD-10-CM | POA: Diagnosis not present

## 2022-03-08 DIAGNOSIS — I129 Hypertensive chronic kidney disease with stage 1 through stage 4 chronic kidney disease, or unspecified chronic kidney disease: Secondary | ICD-10-CM | POA: Diagnosis not present

## 2022-03-08 DIAGNOSIS — N189 Chronic kidney disease, unspecified: Secondary | ICD-10-CM | POA: Diagnosis not present

## 2022-03-08 DIAGNOSIS — E1122 Type 2 diabetes mellitus with diabetic chronic kidney disease: Secondary | ICD-10-CM | POA: Diagnosis not present

## 2022-03-15 DIAGNOSIS — M25561 Pain in right knee: Secondary | ICD-10-CM | POA: Diagnosis not present

## 2022-03-17 DIAGNOSIS — N189 Chronic kidney disease, unspecified: Secondary | ICD-10-CM | POA: Diagnosis not present

## 2022-03-17 DIAGNOSIS — E1122 Type 2 diabetes mellitus with diabetic chronic kidney disease: Secondary | ICD-10-CM | POA: Diagnosis not present

## 2022-03-17 DIAGNOSIS — I129 Hypertensive chronic kidney disease with stage 1 through stage 4 chronic kidney disease, or unspecified chronic kidney disease: Secondary | ICD-10-CM | POA: Diagnosis not present

## 2022-07-07 DIAGNOSIS — E1122 Type 2 diabetes mellitus with diabetic chronic kidney disease: Secondary | ICD-10-CM | POA: Diagnosis not present

## 2022-07-07 DIAGNOSIS — I129 Hypertensive chronic kidney disease with stage 1 through stage 4 chronic kidney disease, or unspecified chronic kidney disease: Secondary | ICD-10-CM | POA: Diagnosis not present

## 2022-07-07 DIAGNOSIS — N189 Chronic kidney disease, unspecified: Secondary | ICD-10-CM | POA: Diagnosis not present

## 2022-07-09 DIAGNOSIS — M25561 Pain in right knee: Secondary | ICD-10-CM | POA: Diagnosis not present

## 2022-07-14 DIAGNOSIS — E1122 Type 2 diabetes mellitus with diabetic chronic kidney disease: Secondary | ICD-10-CM | POA: Diagnosis not present

## 2022-07-14 DIAGNOSIS — D7589 Other specified diseases of blood and blood-forming organs: Secondary | ICD-10-CM | POA: Diagnosis not present

## 2022-07-14 DIAGNOSIS — N189 Chronic kidney disease, unspecified: Secondary | ICD-10-CM | POA: Diagnosis not present

## 2022-07-14 DIAGNOSIS — I129 Hypertensive chronic kidney disease with stage 1 through stage 4 chronic kidney disease, or unspecified chronic kidney disease: Secondary | ICD-10-CM | POA: Diagnosis not present

## 2022-07-21 DIAGNOSIS — Z1231 Encounter for screening mammogram for malignant neoplasm of breast: Secondary | ICD-10-CM | POA: Diagnosis not present

## 2022-07-28 DIAGNOSIS — E119 Type 2 diabetes mellitus without complications: Secondary | ICD-10-CM | POA: Diagnosis not present

## 2022-07-28 DIAGNOSIS — Z961 Presence of intraocular lens: Secondary | ICD-10-CM | POA: Diagnosis not present

## 2022-09-16 DIAGNOSIS — I10 Essential (primary) hypertension: Secondary | ICD-10-CM | POA: Diagnosis not present

## 2022-09-16 DIAGNOSIS — Z6824 Body mass index (BMI) 24.0-24.9, adult: Secondary | ICD-10-CM | POA: Diagnosis not present

## 2022-09-16 DIAGNOSIS — M1991 Primary osteoarthritis, unspecified site: Secondary | ICD-10-CM | POA: Diagnosis not present

## 2022-09-16 DIAGNOSIS — Z0000 Encounter for general adult medical examination without abnormal findings: Secondary | ICD-10-CM | POA: Diagnosis not present

## 2022-09-16 DIAGNOSIS — Z23 Encounter for immunization: Secondary | ICD-10-CM | POA: Diagnosis not present

## 2022-09-16 DIAGNOSIS — N1832 Chronic kidney disease, stage 3b: Secondary | ICD-10-CM | POA: Diagnosis not present

## 2022-09-16 DIAGNOSIS — E1122 Type 2 diabetes mellitus with diabetic chronic kidney disease: Secondary | ICD-10-CM | POA: Diagnosis not present

## 2022-09-17 DIAGNOSIS — Z0001 Encounter for general adult medical examination with abnormal findings: Secondary | ICD-10-CM | POA: Diagnosis not present

## 2022-09-17 DIAGNOSIS — E039 Hypothyroidism, unspecified: Secondary | ICD-10-CM | POA: Diagnosis not present

## 2022-09-17 DIAGNOSIS — D518 Other vitamin B12 deficiency anemias: Secondary | ICD-10-CM | POA: Diagnosis not present

## 2022-09-17 DIAGNOSIS — E559 Vitamin D deficiency, unspecified: Secondary | ICD-10-CM | POA: Diagnosis not present

## 2022-10-11 DIAGNOSIS — M7061 Trochanteric bursitis, right hip: Secondary | ICD-10-CM | POA: Diagnosis not present

## 2022-10-11 DIAGNOSIS — S335XXA Sprain of ligaments of lumbar spine, initial encounter: Secondary | ICD-10-CM | POA: Diagnosis not present

## 2022-10-11 DIAGNOSIS — M9903 Segmental and somatic dysfunction of lumbar region: Secondary | ICD-10-CM | POA: Diagnosis not present

## 2022-10-11 DIAGNOSIS — M9902 Segmental and somatic dysfunction of thoracic region: Secondary | ICD-10-CM | POA: Diagnosis not present

## 2022-10-13 DIAGNOSIS — M9902 Segmental and somatic dysfunction of thoracic region: Secondary | ICD-10-CM | POA: Diagnosis not present

## 2022-10-13 DIAGNOSIS — S335XXA Sprain of ligaments of lumbar spine, initial encounter: Secondary | ICD-10-CM | POA: Diagnosis not present

## 2022-10-13 DIAGNOSIS — M7061 Trochanteric bursitis, right hip: Secondary | ICD-10-CM | POA: Diagnosis not present

## 2022-10-13 DIAGNOSIS — M9903 Segmental and somatic dysfunction of lumbar region: Secondary | ICD-10-CM | POA: Diagnosis not present

## 2022-10-18 DIAGNOSIS — M7061 Trochanteric bursitis, right hip: Secondary | ICD-10-CM | POA: Diagnosis not present

## 2022-10-18 DIAGNOSIS — M9902 Segmental and somatic dysfunction of thoracic region: Secondary | ICD-10-CM | POA: Diagnosis not present

## 2022-10-18 DIAGNOSIS — M9903 Segmental and somatic dysfunction of lumbar region: Secondary | ICD-10-CM | POA: Diagnosis not present

## 2022-10-18 DIAGNOSIS — S335XXA Sprain of ligaments of lumbar spine, initial encounter: Secondary | ICD-10-CM | POA: Diagnosis not present

## 2022-10-21 DIAGNOSIS — M9902 Segmental and somatic dysfunction of thoracic region: Secondary | ICD-10-CM | POA: Diagnosis not present

## 2022-10-21 DIAGNOSIS — M9903 Segmental and somatic dysfunction of lumbar region: Secondary | ICD-10-CM | POA: Diagnosis not present

## 2022-10-21 DIAGNOSIS — M7061 Trochanteric bursitis, right hip: Secondary | ICD-10-CM | POA: Diagnosis not present

## 2022-10-21 DIAGNOSIS — S335XXA Sprain of ligaments of lumbar spine, initial encounter: Secondary | ICD-10-CM | POA: Diagnosis not present

## 2022-10-28 DIAGNOSIS — M9902 Segmental and somatic dysfunction of thoracic region: Secondary | ICD-10-CM | POA: Diagnosis not present

## 2022-10-28 DIAGNOSIS — S335XXA Sprain of ligaments of lumbar spine, initial encounter: Secondary | ICD-10-CM | POA: Diagnosis not present

## 2022-10-28 DIAGNOSIS — M9903 Segmental and somatic dysfunction of lumbar region: Secondary | ICD-10-CM | POA: Diagnosis not present

## 2022-10-28 DIAGNOSIS — M7061 Trochanteric bursitis, right hip: Secondary | ICD-10-CM | POA: Diagnosis not present

## 2022-11-03 DIAGNOSIS — M7061 Trochanteric bursitis, right hip: Secondary | ICD-10-CM | POA: Diagnosis not present

## 2022-11-03 DIAGNOSIS — S335XXA Sprain of ligaments of lumbar spine, initial encounter: Secondary | ICD-10-CM | POA: Diagnosis not present

## 2022-11-03 DIAGNOSIS — M9903 Segmental and somatic dysfunction of lumbar region: Secondary | ICD-10-CM | POA: Diagnosis not present

## 2022-11-03 DIAGNOSIS — M9902 Segmental and somatic dysfunction of thoracic region: Secondary | ICD-10-CM | POA: Diagnosis not present

## 2022-11-10 DIAGNOSIS — M9902 Segmental and somatic dysfunction of thoracic region: Secondary | ICD-10-CM | POA: Diagnosis not present

## 2022-11-10 DIAGNOSIS — M9903 Segmental and somatic dysfunction of lumbar region: Secondary | ICD-10-CM | POA: Diagnosis not present

## 2022-11-10 DIAGNOSIS — S335XXA Sprain of ligaments of lumbar spine, initial encounter: Secondary | ICD-10-CM | POA: Diagnosis not present

## 2022-11-10 DIAGNOSIS — M7061 Trochanteric bursitis, right hip: Secondary | ICD-10-CM | POA: Diagnosis not present

## 2022-11-17 DIAGNOSIS — S335XXA Sprain of ligaments of lumbar spine, initial encounter: Secondary | ICD-10-CM | POA: Diagnosis not present

## 2022-11-17 DIAGNOSIS — M9903 Segmental and somatic dysfunction of lumbar region: Secondary | ICD-10-CM | POA: Diagnosis not present

## 2022-11-17 DIAGNOSIS — M9902 Segmental and somatic dysfunction of thoracic region: Secondary | ICD-10-CM | POA: Diagnosis not present

## 2022-11-17 DIAGNOSIS — M7061 Trochanteric bursitis, right hip: Secondary | ICD-10-CM | POA: Diagnosis not present

## 2022-11-24 DIAGNOSIS — S335XXA Sprain of ligaments of lumbar spine, initial encounter: Secondary | ICD-10-CM | POA: Diagnosis not present

## 2022-11-24 DIAGNOSIS — M9902 Segmental and somatic dysfunction of thoracic region: Secondary | ICD-10-CM | POA: Diagnosis not present

## 2022-11-24 DIAGNOSIS — M9903 Segmental and somatic dysfunction of lumbar region: Secondary | ICD-10-CM | POA: Diagnosis not present

## 2022-11-24 DIAGNOSIS — M7061 Trochanteric bursitis, right hip: Secondary | ICD-10-CM | POA: Diagnosis not present

## 2022-11-26 DIAGNOSIS — E1122 Type 2 diabetes mellitus with diabetic chronic kidney disease: Secondary | ICD-10-CM | POA: Diagnosis not present

## 2022-11-26 DIAGNOSIS — I129 Hypertensive chronic kidney disease with stage 1 through stage 4 chronic kidney disease, or unspecified chronic kidney disease: Secondary | ICD-10-CM | POA: Diagnosis not present

## 2022-11-26 DIAGNOSIS — D7589 Other specified diseases of blood and blood-forming organs: Secondary | ICD-10-CM | POA: Diagnosis not present

## 2022-11-26 DIAGNOSIS — N189 Chronic kidney disease, unspecified: Secondary | ICD-10-CM | POA: Diagnosis not present

## 2022-11-30 DIAGNOSIS — R030 Elevated blood-pressure reading, without diagnosis of hypertension: Secondary | ICD-10-CM | POA: Diagnosis not present

## 2022-11-30 DIAGNOSIS — R059 Cough, unspecified: Secondary | ICD-10-CM | POA: Diagnosis not present

## 2022-12-01 DIAGNOSIS — M9902 Segmental and somatic dysfunction of thoracic region: Secondary | ICD-10-CM | POA: Diagnosis not present

## 2022-12-01 DIAGNOSIS — S335XXA Sprain of ligaments of lumbar spine, initial encounter: Secondary | ICD-10-CM | POA: Diagnosis not present

## 2022-12-01 DIAGNOSIS — M7061 Trochanteric bursitis, right hip: Secondary | ICD-10-CM | POA: Diagnosis not present

## 2022-12-01 DIAGNOSIS — M9903 Segmental and somatic dysfunction of lumbar region: Secondary | ICD-10-CM | POA: Diagnosis not present

## 2022-12-08 DIAGNOSIS — M9903 Segmental and somatic dysfunction of lumbar region: Secondary | ICD-10-CM | POA: Diagnosis not present

## 2022-12-08 DIAGNOSIS — M7061 Trochanteric bursitis, right hip: Secondary | ICD-10-CM | POA: Diagnosis not present

## 2022-12-08 DIAGNOSIS — M9902 Segmental and somatic dysfunction of thoracic region: Secondary | ICD-10-CM | POA: Diagnosis not present

## 2022-12-08 DIAGNOSIS — S335XXA Sprain of ligaments of lumbar spine, initial encounter: Secondary | ICD-10-CM | POA: Diagnosis not present

## 2022-12-15 DIAGNOSIS — M9902 Segmental and somatic dysfunction of thoracic region: Secondary | ICD-10-CM | POA: Diagnosis not present

## 2022-12-15 DIAGNOSIS — S335XXA Sprain of ligaments of lumbar spine, initial encounter: Secondary | ICD-10-CM | POA: Diagnosis not present

## 2022-12-15 DIAGNOSIS — M7061 Trochanteric bursitis, right hip: Secondary | ICD-10-CM | POA: Diagnosis not present

## 2022-12-15 DIAGNOSIS — M9903 Segmental and somatic dysfunction of lumbar region: Secondary | ICD-10-CM | POA: Diagnosis not present

## 2022-12-23 DIAGNOSIS — D7589 Other specified diseases of blood and blood-forming organs: Secondary | ICD-10-CM | POA: Diagnosis not present

## 2022-12-23 DIAGNOSIS — E1122 Type 2 diabetes mellitus with diabetic chronic kidney disease: Secondary | ICD-10-CM | POA: Diagnosis not present

## 2022-12-23 DIAGNOSIS — E1129 Type 2 diabetes mellitus with other diabetic kidney complication: Secondary | ICD-10-CM | POA: Diagnosis not present

## 2022-12-23 DIAGNOSIS — N189 Chronic kidney disease, unspecified: Secondary | ICD-10-CM | POA: Diagnosis not present

## 2022-12-23 DIAGNOSIS — R809 Proteinuria, unspecified: Secondary | ICD-10-CM | POA: Diagnosis not present

## 2022-12-23 DIAGNOSIS — I129 Hypertensive chronic kidney disease with stage 1 through stage 4 chronic kidney disease, or unspecified chronic kidney disease: Secondary | ICD-10-CM | POA: Diagnosis not present

## 2022-12-29 DIAGNOSIS — M7061 Trochanteric bursitis, right hip: Secondary | ICD-10-CM | POA: Diagnosis not present

## 2022-12-29 DIAGNOSIS — S335XXA Sprain of ligaments of lumbar spine, initial encounter: Secondary | ICD-10-CM | POA: Diagnosis not present

## 2022-12-29 DIAGNOSIS — M9903 Segmental and somatic dysfunction of lumbar region: Secondary | ICD-10-CM | POA: Diagnosis not present

## 2022-12-29 DIAGNOSIS — M9902 Segmental and somatic dysfunction of thoracic region: Secondary | ICD-10-CM | POA: Diagnosis not present

## 2023-01-05 DIAGNOSIS — I129 Hypertensive chronic kidney disease with stage 1 through stage 4 chronic kidney disease, or unspecified chronic kidney disease: Secondary | ICD-10-CM | POA: Diagnosis not present

## 2023-01-05 DIAGNOSIS — E1122 Type 2 diabetes mellitus with diabetic chronic kidney disease: Secondary | ICD-10-CM | POA: Diagnosis not present

## 2023-01-05 DIAGNOSIS — R809 Proteinuria, unspecified: Secondary | ICD-10-CM | POA: Diagnosis not present

## 2023-01-05 DIAGNOSIS — E1129 Type 2 diabetes mellitus with other diabetic kidney complication: Secondary | ICD-10-CM | POA: Diagnosis not present

## 2023-01-05 DIAGNOSIS — N189 Chronic kidney disease, unspecified: Secondary | ICD-10-CM | POA: Diagnosis not present

## 2023-01-12 DIAGNOSIS — S335XXA Sprain of ligaments of lumbar spine, initial encounter: Secondary | ICD-10-CM | POA: Diagnosis not present

## 2023-01-12 DIAGNOSIS — M9903 Segmental and somatic dysfunction of lumbar region: Secondary | ICD-10-CM | POA: Diagnosis not present

## 2023-01-12 DIAGNOSIS — M7061 Trochanteric bursitis, right hip: Secondary | ICD-10-CM | POA: Diagnosis not present

## 2023-01-12 DIAGNOSIS — M9902 Segmental and somatic dysfunction of thoracic region: Secondary | ICD-10-CM | POA: Diagnosis not present

## 2023-01-26 DIAGNOSIS — M7061 Trochanteric bursitis, right hip: Secondary | ICD-10-CM | POA: Diagnosis not present

## 2023-01-26 DIAGNOSIS — M9903 Segmental and somatic dysfunction of lumbar region: Secondary | ICD-10-CM | POA: Diagnosis not present

## 2023-01-26 DIAGNOSIS — S335XXA Sprain of ligaments of lumbar spine, initial encounter: Secondary | ICD-10-CM | POA: Diagnosis not present

## 2023-01-26 DIAGNOSIS — M9902 Segmental and somatic dysfunction of thoracic region: Secondary | ICD-10-CM | POA: Diagnosis not present

## 2023-02-09 DIAGNOSIS — M9903 Segmental and somatic dysfunction of lumbar region: Secondary | ICD-10-CM | POA: Diagnosis not present

## 2023-02-09 DIAGNOSIS — M9902 Segmental and somatic dysfunction of thoracic region: Secondary | ICD-10-CM | POA: Diagnosis not present

## 2023-02-09 DIAGNOSIS — S335XXA Sprain of ligaments of lumbar spine, initial encounter: Secondary | ICD-10-CM | POA: Diagnosis not present

## 2023-02-09 DIAGNOSIS — M7061 Trochanteric bursitis, right hip: Secondary | ICD-10-CM | POA: Diagnosis not present

## 2023-02-23 DIAGNOSIS — S335XXA Sprain of ligaments of lumbar spine, initial encounter: Secondary | ICD-10-CM | POA: Diagnosis not present

## 2023-02-23 DIAGNOSIS — M9902 Segmental and somatic dysfunction of thoracic region: Secondary | ICD-10-CM | POA: Diagnosis not present

## 2023-02-23 DIAGNOSIS — M7061 Trochanteric bursitis, right hip: Secondary | ICD-10-CM | POA: Diagnosis not present

## 2023-02-23 DIAGNOSIS — M9903 Segmental and somatic dysfunction of lumbar region: Secondary | ICD-10-CM | POA: Diagnosis not present

## 2023-03-09 DIAGNOSIS — M9903 Segmental and somatic dysfunction of lumbar region: Secondary | ICD-10-CM | POA: Diagnosis not present

## 2023-03-09 DIAGNOSIS — M9902 Segmental and somatic dysfunction of thoracic region: Secondary | ICD-10-CM | POA: Diagnosis not present

## 2023-03-09 DIAGNOSIS — S335XXA Sprain of ligaments of lumbar spine, initial encounter: Secondary | ICD-10-CM | POA: Diagnosis not present

## 2023-03-09 DIAGNOSIS — M7061 Trochanteric bursitis, right hip: Secondary | ICD-10-CM | POA: Diagnosis not present

## 2023-03-23 DIAGNOSIS — M7061 Trochanteric bursitis, right hip: Secondary | ICD-10-CM | POA: Diagnosis not present

## 2023-03-23 DIAGNOSIS — M9903 Segmental and somatic dysfunction of lumbar region: Secondary | ICD-10-CM | POA: Diagnosis not present

## 2023-03-23 DIAGNOSIS — S335XXA Sprain of ligaments of lumbar spine, initial encounter: Secondary | ICD-10-CM | POA: Diagnosis not present

## 2023-03-23 DIAGNOSIS — M9902 Segmental and somatic dysfunction of thoracic region: Secondary | ICD-10-CM | POA: Diagnosis not present

## 2023-04-06 DIAGNOSIS — M9903 Segmental and somatic dysfunction of lumbar region: Secondary | ICD-10-CM | POA: Diagnosis not present

## 2023-04-06 DIAGNOSIS — S335XXA Sprain of ligaments of lumbar spine, initial encounter: Secondary | ICD-10-CM | POA: Diagnosis not present

## 2023-04-06 DIAGNOSIS — M9902 Segmental and somatic dysfunction of thoracic region: Secondary | ICD-10-CM | POA: Diagnosis not present

## 2023-04-06 DIAGNOSIS — M7061 Trochanteric bursitis, right hip: Secondary | ICD-10-CM | POA: Diagnosis not present

## 2023-04-20 DIAGNOSIS — M9902 Segmental and somatic dysfunction of thoracic region: Secondary | ICD-10-CM | POA: Diagnosis not present

## 2023-04-20 DIAGNOSIS — M7061 Trochanteric bursitis, right hip: Secondary | ICD-10-CM | POA: Diagnosis not present

## 2023-04-20 DIAGNOSIS — M9903 Segmental and somatic dysfunction of lumbar region: Secondary | ICD-10-CM | POA: Diagnosis not present

## 2023-04-20 DIAGNOSIS — S335XXA Sprain of ligaments of lumbar spine, initial encounter: Secondary | ICD-10-CM | POA: Diagnosis not present

## 2023-04-27 DIAGNOSIS — R809 Proteinuria, unspecified: Secondary | ICD-10-CM | POA: Diagnosis not present

## 2023-04-27 DIAGNOSIS — E1122 Type 2 diabetes mellitus with diabetic chronic kidney disease: Secondary | ICD-10-CM | POA: Diagnosis not present

## 2023-04-27 DIAGNOSIS — N189 Chronic kidney disease, unspecified: Secondary | ICD-10-CM | POA: Diagnosis not present

## 2023-04-27 DIAGNOSIS — I129 Hypertensive chronic kidney disease with stage 1 through stage 4 chronic kidney disease, or unspecified chronic kidney disease: Secondary | ICD-10-CM | POA: Diagnosis not present

## 2023-04-27 DIAGNOSIS — E1129 Type 2 diabetes mellitus with other diabetic kidney complication: Secondary | ICD-10-CM | POA: Diagnosis not present

## 2023-04-29 DIAGNOSIS — M25561 Pain in right knee: Secondary | ICD-10-CM | POA: Diagnosis not present

## 2023-05-04 DIAGNOSIS — D7589 Other specified diseases of blood and blood-forming organs: Secondary | ICD-10-CM | POA: Diagnosis not present

## 2023-05-04 DIAGNOSIS — I129 Hypertensive chronic kidney disease with stage 1 through stage 4 chronic kidney disease, or unspecified chronic kidney disease: Secondary | ICD-10-CM | POA: Diagnosis not present

## 2023-05-04 DIAGNOSIS — M9903 Segmental and somatic dysfunction of lumbar region: Secondary | ICD-10-CM | POA: Diagnosis not present

## 2023-05-04 DIAGNOSIS — E1122 Type 2 diabetes mellitus with diabetic chronic kidney disease: Secondary | ICD-10-CM | POA: Diagnosis not present

## 2023-05-04 DIAGNOSIS — M7061 Trochanteric bursitis, right hip: Secondary | ICD-10-CM | POA: Diagnosis not present

## 2023-05-04 DIAGNOSIS — M9902 Segmental and somatic dysfunction of thoracic region: Secondary | ICD-10-CM | POA: Diagnosis not present

## 2023-05-04 DIAGNOSIS — R809 Proteinuria, unspecified: Secondary | ICD-10-CM | POA: Diagnosis not present

## 2023-05-04 DIAGNOSIS — S335XXA Sprain of ligaments of lumbar spine, initial encounter: Secondary | ICD-10-CM | POA: Diagnosis not present

## 2023-05-18 DIAGNOSIS — M7061 Trochanteric bursitis, right hip: Secondary | ICD-10-CM | POA: Diagnosis not present

## 2023-05-18 DIAGNOSIS — S335XXA Sprain of ligaments of lumbar spine, initial encounter: Secondary | ICD-10-CM | POA: Diagnosis not present

## 2023-05-18 DIAGNOSIS — M9903 Segmental and somatic dysfunction of lumbar region: Secondary | ICD-10-CM | POA: Diagnosis not present

## 2023-05-18 DIAGNOSIS — M9902 Segmental and somatic dysfunction of thoracic region: Secondary | ICD-10-CM | POA: Diagnosis not present

## 2023-06-01 DIAGNOSIS — M9903 Segmental and somatic dysfunction of lumbar region: Secondary | ICD-10-CM | POA: Diagnosis not present

## 2023-06-01 DIAGNOSIS — M7061 Trochanteric bursitis, right hip: Secondary | ICD-10-CM | POA: Diagnosis not present

## 2023-06-01 DIAGNOSIS — S335XXA Sprain of ligaments of lumbar spine, initial encounter: Secondary | ICD-10-CM | POA: Diagnosis not present

## 2023-06-01 DIAGNOSIS — M9902 Segmental and somatic dysfunction of thoracic region: Secondary | ICD-10-CM | POA: Diagnosis not present

## 2023-06-22 DIAGNOSIS — S335XXA Sprain of ligaments of lumbar spine, initial encounter: Secondary | ICD-10-CM | POA: Diagnosis not present

## 2023-06-22 DIAGNOSIS — M9902 Segmental and somatic dysfunction of thoracic region: Secondary | ICD-10-CM | POA: Diagnosis not present

## 2023-06-22 DIAGNOSIS — M7061 Trochanteric bursitis, right hip: Secondary | ICD-10-CM | POA: Diagnosis not present

## 2023-06-22 DIAGNOSIS — M9903 Segmental and somatic dysfunction of lumbar region: Secondary | ICD-10-CM | POA: Diagnosis not present

## 2023-07-06 DIAGNOSIS — M9903 Segmental and somatic dysfunction of lumbar region: Secondary | ICD-10-CM | POA: Diagnosis not present

## 2023-07-06 DIAGNOSIS — S335XXA Sprain of ligaments of lumbar spine, initial encounter: Secondary | ICD-10-CM | POA: Diagnosis not present

## 2023-07-06 DIAGNOSIS — M9902 Segmental and somatic dysfunction of thoracic region: Secondary | ICD-10-CM | POA: Diagnosis not present

## 2023-07-06 DIAGNOSIS — M7061 Trochanteric bursitis, right hip: Secondary | ICD-10-CM | POA: Diagnosis not present

## 2023-07-27 DIAGNOSIS — Z1231 Encounter for screening mammogram for malignant neoplasm of breast: Secondary | ICD-10-CM | POA: Diagnosis not present

## 2023-08-03 DIAGNOSIS — E119 Type 2 diabetes mellitus without complications: Secondary | ICD-10-CM | POA: Diagnosis not present

## 2023-08-03 DIAGNOSIS — Z961 Presence of intraocular lens: Secondary | ICD-10-CM | POA: Diagnosis not present

## 2023-08-10 DIAGNOSIS — M7061 Trochanteric bursitis, right hip: Secondary | ICD-10-CM | POA: Diagnosis not present

## 2023-08-10 DIAGNOSIS — M9902 Segmental and somatic dysfunction of thoracic region: Secondary | ICD-10-CM | POA: Diagnosis not present

## 2023-08-10 DIAGNOSIS — S335XXA Sprain of ligaments of lumbar spine, initial encounter: Secondary | ICD-10-CM | POA: Diagnosis not present

## 2023-08-10 DIAGNOSIS — M9903 Segmental and somatic dysfunction of lumbar region: Secondary | ICD-10-CM | POA: Diagnosis not present

## 2023-08-24 DIAGNOSIS — S335XXA Sprain of ligaments of lumbar spine, initial encounter: Secondary | ICD-10-CM | POA: Diagnosis not present

## 2023-08-24 DIAGNOSIS — M7061 Trochanteric bursitis, right hip: Secondary | ICD-10-CM | POA: Diagnosis not present

## 2023-08-24 DIAGNOSIS — M9903 Segmental and somatic dysfunction of lumbar region: Secondary | ICD-10-CM | POA: Diagnosis not present

## 2023-08-24 DIAGNOSIS — M9902 Segmental and somatic dysfunction of thoracic region: Secondary | ICD-10-CM | POA: Diagnosis not present

## 2023-09-07 DIAGNOSIS — M7061 Trochanteric bursitis, right hip: Secondary | ICD-10-CM | POA: Diagnosis not present

## 2023-09-07 DIAGNOSIS — M9902 Segmental and somatic dysfunction of thoracic region: Secondary | ICD-10-CM | POA: Diagnosis not present

## 2023-09-07 DIAGNOSIS — M9903 Segmental and somatic dysfunction of lumbar region: Secondary | ICD-10-CM | POA: Diagnosis not present

## 2023-09-07 DIAGNOSIS — S335XXA Sprain of ligaments of lumbar spine, initial encounter: Secondary | ICD-10-CM | POA: Diagnosis not present

## 2023-09-20 DIAGNOSIS — E1165 Type 2 diabetes mellitus with hyperglycemia: Secondary | ICD-10-CM | POA: Diagnosis not present

## 2023-09-20 DIAGNOSIS — E039 Hypothyroidism, unspecified: Secondary | ICD-10-CM | POA: Diagnosis not present

## 2023-09-20 DIAGNOSIS — I10 Essential (primary) hypertension: Secondary | ICD-10-CM | POA: Diagnosis not present

## 2023-09-20 DIAGNOSIS — Z0001 Encounter for general adult medical examination with abnormal findings: Secondary | ICD-10-CM | POA: Diagnosis not present

## 2023-09-20 DIAGNOSIS — E559 Vitamin D deficiency, unspecified: Secondary | ICD-10-CM | POA: Diagnosis not present

## 2023-09-20 DIAGNOSIS — Z23 Encounter for immunization: Secondary | ICD-10-CM | POA: Diagnosis not present

## 2023-09-20 DIAGNOSIS — M1991 Primary osteoarthritis, unspecified site: Secondary | ICD-10-CM | POA: Diagnosis not present

## 2023-09-20 DIAGNOSIS — N1831 Chronic kidney disease, stage 3a: Secondary | ICD-10-CM | POA: Diagnosis not present

## 2023-09-20 DIAGNOSIS — D518 Other vitamin B12 deficiency anemias: Secondary | ICD-10-CM | POA: Diagnosis not present

## 2023-09-20 DIAGNOSIS — E1122 Type 2 diabetes mellitus with diabetic chronic kidney disease: Secondary | ICD-10-CM | POA: Diagnosis not present

## 2023-10-12 DIAGNOSIS — S335XXA Sprain of ligaments of lumbar spine, initial encounter: Secondary | ICD-10-CM | POA: Diagnosis not present

## 2023-10-12 DIAGNOSIS — M9903 Segmental and somatic dysfunction of lumbar region: Secondary | ICD-10-CM | POA: Diagnosis not present

## 2023-10-12 DIAGNOSIS — M7061 Trochanteric bursitis, right hip: Secondary | ICD-10-CM | POA: Diagnosis not present

## 2023-10-12 DIAGNOSIS — M9902 Segmental and somatic dysfunction of thoracic region: Secondary | ICD-10-CM | POA: Diagnosis not present

## 2023-10-18 DIAGNOSIS — I129 Hypertensive chronic kidney disease with stage 1 through stage 4 chronic kidney disease, or unspecified chronic kidney disease: Secondary | ICD-10-CM | POA: Diagnosis not present

## 2023-10-18 DIAGNOSIS — N1831 Chronic kidney disease, stage 3a: Secondary | ICD-10-CM | POA: Diagnosis not present

## 2023-10-18 DIAGNOSIS — N189 Chronic kidney disease, unspecified: Secondary | ICD-10-CM | POA: Diagnosis not present

## 2023-10-18 DIAGNOSIS — R809 Proteinuria, unspecified: Secondary | ICD-10-CM | POA: Diagnosis not present

## 2023-10-24 DIAGNOSIS — E1122 Type 2 diabetes mellitus with diabetic chronic kidney disease: Secondary | ICD-10-CM | POA: Diagnosis not present

## 2023-10-24 DIAGNOSIS — D7589 Other specified diseases of blood and blood-forming organs: Secondary | ICD-10-CM | POA: Diagnosis not present

## 2023-10-24 DIAGNOSIS — I129 Hypertensive chronic kidney disease with stage 1 through stage 4 chronic kidney disease, or unspecified chronic kidney disease: Secondary | ICD-10-CM | POA: Diagnosis not present

## 2023-10-24 DIAGNOSIS — N1831 Chronic kidney disease, stage 3a: Secondary | ICD-10-CM | POA: Diagnosis not present

## 2023-11-09 DIAGNOSIS — S335XXA Sprain of ligaments of lumbar spine, initial encounter: Secondary | ICD-10-CM | POA: Diagnosis not present

## 2023-11-09 DIAGNOSIS — M9903 Segmental and somatic dysfunction of lumbar region: Secondary | ICD-10-CM | POA: Diagnosis not present

## 2023-11-09 DIAGNOSIS — M9902 Segmental and somatic dysfunction of thoracic region: Secondary | ICD-10-CM | POA: Diagnosis not present

## 2023-11-09 DIAGNOSIS — M7061 Trochanteric bursitis, right hip: Secondary | ICD-10-CM | POA: Diagnosis not present

## 2024-03-21 DIAGNOSIS — J069 Acute upper respiratory infection, unspecified: Secondary | ICD-10-CM | POA: Diagnosis not present

## 2024-03-21 DIAGNOSIS — R030 Elevated blood-pressure reading, without diagnosis of hypertension: Secondary | ICD-10-CM | POA: Diagnosis not present

## 2024-03-21 DIAGNOSIS — R059 Cough, unspecified: Secondary | ICD-10-CM | POA: Diagnosis not present

## 2024-04-02 DIAGNOSIS — R809 Proteinuria, unspecified: Secondary | ICD-10-CM | POA: Diagnosis not present

## 2024-04-02 DIAGNOSIS — E1129 Type 2 diabetes mellitus with other diabetic kidney complication: Secondary | ICD-10-CM | POA: Diagnosis not present

## 2024-04-02 DIAGNOSIS — N1831 Chronic kidney disease, stage 3a: Secondary | ICD-10-CM | POA: Diagnosis not present

## 2024-04-02 DIAGNOSIS — I129 Hypertensive chronic kidney disease with stage 1 through stage 4 chronic kidney disease, or unspecified chronic kidney disease: Secondary | ICD-10-CM | POA: Diagnosis not present

## 2024-04-02 DIAGNOSIS — E1122 Type 2 diabetes mellitus with diabetic chronic kidney disease: Secondary | ICD-10-CM | POA: Diagnosis not present

## 2024-04-09 DIAGNOSIS — E1129 Type 2 diabetes mellitus with other diabetic kidney complication: Secondary | ICD-10-CM | POA: Diagnosis not present

## 2024-04-09 DIAGNOSIS — R809 Proteinuria, unspecified: Secondary | ICD-10-CM | POA: Diagnosis not present

## 2024-04-09 DIAGNOSIS — E1122 Type 2 diabetes mellitus with diabetic chronic kidney disease: Secondary | ICD-10-CM | POA: Diagnosis not present

## 2024-04-09 DIAGNOSIS — I129 Hypertensive chronic kidney disease with stage 1 through stage 4 chronic kidney disease, or unspecified chronic kidney disease: Secondary | ICD-10-CM | POA: Diagnosis not present

## 2024-04-23 DIAGNOSIS — N189 Chronic kidney disease, unspecified: Secondary | ICD-10-CM | POA: Diagnosis not present

## 2024-05-07 DIAGNOSIS — R809 Proteinuria, unspecified: Secondary | ICD-10-CM | POA: Diagnosis not present

## 2024-05-07 DIAGNOSIS — E876 Hypokalemia: Secondary | ICD-10-CM | POA: Diagnosis not present

## 2024-05-07 DIAGNOSIS — N1832 Chronic kidney disease, stage 3b: Secondary | ICD-10-CM | POA: Diagnosis not present

## 2024-05-07 DIAGNOSIS — E1129 Type 2 diabetes mellitus with other diabetic kidney complication: Secondary | ICD-10-CM | POA: Diagnosis not present

## 2024-06-29 ENCOUNTER — Ambulatory Visit
Admission: EM | Admit: 2024-06-29 | Discharge: 2024-06-29 | Disposition: A | Discharge status: Home / Self Care | Attending: Nurse Practitioner | Admitting: Nurse Practitioner

## 2024-06-29 DIAGNOSIS — L089 Local infection of the skin and subcutaneous tissue, unspecified: Secondary | ICD-10-CM

## 2024-06-29 MED ORDER — DOXYCYCLINE HYCLATE 100 MG PO TABS: 100.0000 mg | ORAL_TABLET | Freq: Two times a day (BID) | ORAL | 0 refills | Status: AC

## 2024-06-29 NOTE — Discharge Instructions (Signed)
 Take medication as prescribed. You may take over-the-counter Tylenol as needed for pain or discomfort. Apply ice to the affected area to help with pain or swelling. Cleanse the area twice daily with warm soap and water . If you have pets in the home, avoid having them lick or come close to the area until symptoms improve. Seek care immediately if you experience increased redness, swelling, foul-smelling drainage from the site, or other concerns. Follow-up as needed.

## 2024-06-29 NOTE — ED Provider Notes (Signed)
 [facility]    CSN: [id number] Arrival date & time: 06/29/24  0903      History   Chief Complaint No chief complaint on file.   HPI Melissa Kane is a 89 y.o. female.   The history is provided by the patient.   Patient presents for complaints of concern for infection to the right hand.  Patient states that she had an area or a sore on her hand near her third knuckle.  States that the area improved, but then returned over the past week.  States that she did attempt to drain it, but states when she woke up this morning, she had increased swelling, redness and tenderness to the right hand.  Patient denies fever, chills, foul-smelling drainage from the site, chest pain, abdominal pain, nausea, vomiting, or diarrhea.  States that she had been applying a cream to the area.  States she does not recall what kind of cream it was.  Past Medical History:  Diagnosis Date   Diabetes mellitus without complication ([facility])    Essential hypertension, benign    Hypercholesteremia    Hyperlipidemia    Impaired glucose tolerance    Thyroid  disease     Patient Active Problem List   Diagnosis Date Noted   Shortness of breath 03/14/2012   Abnormal EKG 03/14/2012   Carotid bruit 03/14/2012   Essential hypertension, benign 03/14/2012   Mixed hyperlipidemia 03/14/2012   Impaired glucose tolerance 03/14/2012    Past Surgical History:  Procedure Laterality Date   APPENDECTOMY     COLONOSCOPY     Patient states she had in [place]/Dr.Rehman greater than 6 years,   COLONOSCOPY N/A 06/18/2015   Procedure: COLONOSCOPY;  Surgeon: Claudis RAYMOND Rivet, MD;  Location: [facility];  Service: Endoscopy;  Laterality: N/A;  830 -- in OR under fluoro   Excision left breast cyst     TUBAL LIGATION      OB History   No obstetric history on file.      Home Medications    Prior to Admission medications   Medication Sig Start Date End Date Taking? Authorizing Provider  atorvastatin (LIPITOR) 80  MG tablet Take 80 mg by mouth daily.    [provider]  Bisacodyl (WOMENS GENTLE LAXATIVE PO) Take 1 tablet by mouth at bedtime as needed.    [provider]  chlorpheniramine (CHLOR-TRIMETON) 4 MG tablet Take 1 tablet by mouth every evening. 07/25/17   [provider]  Coenzyme Q10 50 MG CAPS Take 1 capsule by mouth daily. 07/25/17   [provider]  diphenhydrAMINE (BENADRYL) 25 MG tablet Take 25 mg by mouth at bedtime.     [provider]  Ergocalciferol 50 MCG (2000 UT) CAPS Take 1 capsule by mouth daily.    [provider]  glimepiride (AMARYL) 1 MG tablet Take 1 mg by mouth daily. 04/02/20   [provider]  levothyroxine (SYNTHROID) 25 MCG tablet Take 25 mcg by mouth daily. 05/07/20   [provider]  losartan-hydrochlorothiazide (HYZAAR) 50-12.5 MG per tablet Take 1 tablet by mouth daily.     [provider]  pantoprazole  (PROTONIX ) 20 MG tablet Take 1 tablet (20 mg total) by mouth daily. 05/08/20   Suzette Pac, MD  Turmeric 500 MG CAPS Take 1 capsule by mouth daily.    [provider]    Family History Family History  Problem Relation Age of Onset   Cancer Father  Leukemia   Cancer Sister        Pancreatic   Cancer Sister        Colon   COPD Sister     Social History Social History   Tobacco Use   Smoking status: Former    Types: Cigarettes   Smokeless tobacco: Never  Substance Use Topics   Alcohol use: No    Alcohol/week: 0.0 standard drinks of alcohol   Drug use: No     Allergies   Patient has no known allergies.   Review of Systems Review of Systems Per HPI  Physical Exam Triage Vital Signs ED Triage Vitals  Encounter Vitals Group     BP 06/29/24 0917 (!) 163/72     Girls Systolic BP Percentile --      Girls Diastolic BP Percentile --      Boys Systolic BP Percentile --      Boys Diastolic BP Percentile --      Pulse Rate 06/29/24 0917 74     Resp 06/29/24  0917 16     Temp 06/29/24 0917 98.5 F (36.9 C)     Temp Source 06/29/24 0917 Oral     SpO2 06/29/24 0917 93 %     Weight --      Height --      Head Circumference --      Peak Flow --      Pain Score 06/29/24 0918 3     Pain Loc --      Pain Education --      Exclude from Growth Chart --    No data found.  Updated Vital Signs BP (!) 163/72 (BP Location: Right Arm)   Pulse 74   Temp 98.5 F (36.9 C) (Oral)   Resp 16   SpO2 93%   Visual Acuity Right Eye Distance:   Left Eye Distance:   Bilateral Distance:    Right Eye Near:   Left Eye Near:    Bilateral Near:     Physical Exam Vitals and nursing note reviewed.  Constitutional:      General: She is not in acute distress.    Appearance: Normal appearance.  HENT:     Head: Normocephalic.  Eyes:     Extraocular Movements: Extraocular movements intact.     Conjunctiva/sclera: Conjunctivae normal.     Pupils: Pupils are equal, round, and reactive to light.  Cardiovascular:     Rate and Rhythm: Normal rate and regular rhythm.     Pulses: Normal pulses.     Heart sounds: Normal heart sounds.  Pulmonary:     Effort: Pulmonary effort is normal. No respiratory distress.     Breath sounds: Normal breath sounds. No stridor. No wheezing, rhonchi or rales.  Abdominal:     General: Bowel sounds are normal.     Palpations: Abdomen is soft.  Musculoskeletal:     Cervical back: Normal range of motion.  Lymphadenopathy:     Cervical: No cervical adenopathy.  Skin:    Findings: Wound present.     Comments: Area of erythema noted to the dorsal aspect of the right hand at the third knuckle.  The area is tender to palpation with scabbing present.  There is no fluctuance, oozing, or drainage present.  Neurological:     General: No focal deficit present.     Mental Status: She is alert and oriented to person, place, and time.  Psychiatric:        Mood and  Affect: Mood normal.        Behavior: Behavior normal.      UC  Treatments / Results  Labs (all labs ordered are listed, but only abnormal results are displayed) Labs Reviewed - No data to display  EKG   Radiology No results found.  Procedures Procedures (including critical care time)  Medications Ordered in UC Medications - No data to display  Initial Impression / Assessment and Plan / UC Course  I have reviewed the triage vital signs and the nursing notes.  Pertinent labs & imaging results that were available during my care of the patient were reviewed by me and considered in my medical decision making (see chart for details).  Patient with localized skin infection noted to the dorsal aspect of the right hand.  Will treat with doxycycline  100 mg twice daily for the next 5 days.  Supportive care recommendations were provided and discussed with the patient to include cleansing the area with warm soap and water , over-the-counter analgesics, and applying ice to help with pain or swelling.  Patient was given indications regarding follow-up.  Patient was in agreement with this plan of care and verbalizes understanding.  All questions were answered.  Patient stable for discharge.  Final Clinical Impressions(s) / UC Diagnoses   Final diagnoses:  None   Discharge Instructions   None    ED Prescriptions   None    PDMP not reviewed this encounter.   Gilmer Etta PARAS, NP 06/29/24 [phone]

## 2024-06-29 NOTE — ED Triage Notes (Signed)
 Pt reports she had a scab on her right middle knuckle and when she peeled the scab off it became infected x 2 weeks

## 2024-07-10 DIAGNOSIS — I10 Essential (primary) hypertension: Secondary | ICD-10-CM | POA: Diagnosis not present

## 2024-07-10 DIAGNOSIS — K219 Gastro-esophageal reflux disease without esophagitis: Secondary | ICD-10-CM | POA: Diagnosis not present

## 2024-07-10 DIAGNOSIS — E039 Hypothyroidism, unspecified: Secondary | ICD-10-CM | POA: Diagnosis not present

## 2024-07-10 DIAGNOSIS — Z299 Encounter for prophylactic measures, unspecified: Secondary | ICD-10-CM | POA: Diagnosis not present

## 2024-07-10 DIAGNOSIS — E7800 Pure hypercholesterolemia, unspecified: Secondary | ICD-10-CM | POA: Diagnosis not present

## 2024-07-10 DIAGNOSIS — E559 Vitamin D deficiency, unspecified: Secondary | ICD-10-CM | POA: Diagnosis not present

## 2024-07-13 DIAGNOSIS — I10 Essential (primary) hypertension: Secondary | ICD-10-CM | POA: Diagnosis not present

## 2024-07-13 DIAGNOSIS — M25562 Pain in left knee: Secondary | ICD-10-CM | POA: Diagnosis not present

## 2024-07-13 DIAGNOSIS — Z299 Encounter for prophylactic measures, unspecified: Secondary | ICD-10-CM | POA: Diagnosis not present

## 2024-08-01 DIAGNOSIS — Z1231 Encounter for screening mammogram for malignant neoplasm of breast: Secondary | ICD-10-CM | POA: Diagnosis not present

## 2024-08-08 DIAGNOSIS — Z7189 Other specified counseling: Secondary | ICD-10-CM | POA: Diagnosis not present

## 2024-08-08 DIAGNOSIS — Z1389 Encounter for screening for other disorder: Secondary | ICD-10-CM | POA: Diagnosis not present

## 2024-08-08 DIAGNOSIS — Z299 Encounter for prophylactic measures, unspecified: Secondary | ICD-10-CM | POA: Diagnosis not present

## 2024-08-08 DIAGNOSIS — Z0000 Encounter for general adult medical examination without abnormal findings: Secondary | ICD-10-CM | POA: Diagnosis not present

## 2024-08-08 DIAGNOSIS — I10 Essential (primary) hypertension: Secondary | ICD-10-CM | POA: Diagnosis not present

## 2024-09-24 DIAGNOSIS — E119 Type 2 diabetes mellitus without complications: Secondary | ICD-10-CM | POA: Diagnosis not present

## 2024-09-24 DIAGNOSIS — I10 Essential (primary) hypertension: Secondary | ICD-10-CM | POA: Diagnosis not present

## 2024-09-24 DIAGNOSIS — D631 Anemia in chronic kidney disease: Secondary | ICD-10-CM | POA: Diagnosis not present

## 2024-09-24 DIAGNOSIS — R809 Proteinuria, unspecified: Secondary | ICD-10-CM | POA: Diagnosis not present

## 2024-10-31 ENCOUNTER — Emergency Department (HOSPITAL_COMMUNITY)

## 2024-10-31 ENCOUNTER — Other Ambulatory Visit: Payer: Self-pay

## 2024-10-31 ENCOUNTER — Inpatient Hospital Stay (HOSPITAL_COMMUNITY)
Admission: EM | Admit: 2024-10-31 | Discharge: 2024-11-02 | DRG: 066 | Disposition: A | Discharge status: Home / Self Care | Attending: Family Medicine | Admitting: Family Medicine

## 2024-10-31 ENCOUNTER — Encounter (HOSPITAL_COMMUNITY): Payer: Self-pay

## 2024-10-31 DIAGNOSIS — I63421 Cerebral infarction due to embolism of right anterior cerebral artery: Secondary | ICD-10-CM | POA: Diagnosis not present

## 2024-10-31 DIAGNOSIS — R29810 Facial weakness: Secondary | ICD-10-CM | POA: Diagnosis present

## 2024-10-31 DIAGNOSIS — E1122 Type 2 diabetes mellitus with diabetic chronic kidney disease: Secondary | ICD-10-CM | POA: Diagnosis present

## 2024-10-31 DIAGNOSIS — Z806 Family history of leukemia: Secondary | ICD-10-CM

## 2024-10-31 DIAGNOSIS — Z79890 Hormone replacement therapy (postmenopausal): Secondary | ICD-10-CM

## 2024-10-31 DIAGNOSIS — E119 Type 2 diabetes mellitus without complications: Secondary | ICD-10-CM

## 2024-10-31 DIAGNOSIS — I639 Cerebral infarction, unspecified: Secondary | ICD-10-CM | POA: Diagnosis not present

## 2024-10-31 DIAGNOSIS — E7800 Pure hypercholesterolemia, unspecified: Secondary | ICD-10-CM | POA: Diagnosis present

## 2024-10-31 DIAGNOSIS — R29701 NIHSS score 1: Secondary | ICD-10-CM | POA: Diagnosis present

## 2024-10-31 DIAGNOSIS — I129 Hypertensive chronic kidney disease with stage 1 through stage 4 chronic kidney disease, or unspecified chronic kidney disease: Secondary | ICD-10-CM | POA: Diagnosis present

## 2024-10-31 DIAGNOSIS — Z800 Family history of malignant neoplasm of digestive organs: Secondary | ICD-10-CM

## 2024-10-31 DIAGNOSIS — E079 Disorder of thyroid, unspecified: Secondary | ICD-10-CM | POA: Diagnosis present

## 2024-10-31 DIAGNOSIS — R1310 Dysphagia, unspecified: Secondary | ICD-10-CM | POA: Diagnosis present

## 2024-10-31 DIAGNOSIS — N1832 Chronic kidney disease, stage 3b: Secondary | ICD-10-CM | POA: Diagnosis present

## 2024-10-31 DIAGNOSIS — Z825 Family history of asthma and other chronic lower respiratory diseases: Secondary | ICD-10-CM

## 2024-10-31 DIAGNOSIS — Z7984 Long term (current) use of oral hypoglycemic drugs: Secondary | ICD-10-CM

## 2024-10-31 DIAGNOSIS — K219 Gastro-esophageal reflux disease without esophagitis: Secondary | ICD-10-CM | POA: Diagnosis present

## 2024-10-31 DIAGNOSIS — Z79899 Other long term (current) drug therapy: Secondary | ICD-10-CM

## 2024-10-31 DIAGNOSIS — Z87891 Personal history of nicotine dependence: Secondary | ICD-10-CM

## 2024-10-31 DIAGNOSIS — E039 Hypothyroidism, unspecified: Secondary | ICD-10-CM | POA: Diagnosis present

## 2024-10-31 DIAGNOSIS — I480 Paroxysmal atrial fibrillation: Secondary | ICD-10-CM | POA: Diagnosis present

## 2024-10-31 DIAGNOSIS — E1165 Type 2 diabetes mellitus with hyperglycemia: Secondary | ICD-10-CM | POA: Diagnosis present

## 2024-10-31 DIAGNOSIS — R001 Bradycardia, unspecified: Secondary | ICD-10-CM | POA: Diagnosis present

## 2024-10-31 LAB — CBC
HCT: 39 % (ref 36.0–46.0)
Hemoglobin: 13.6 g/dL (ref 12.0–15.0)
MCH: 33.1 pg (ref 26.0–34.0)
MCHC: 34.9 g/dL (ref 30.0–36.0)
MCV: 94.9 fL (ref 80.0–100.0)
Platelets: 220 K/uL (ref 150–400)
RBC: 4.11 MIL/uL (ref 3.87–5.11)
RDW: 12.7 % (ref 11.5–15.5)
WBC: 7 K/uL (ref 4.0–10.5)
nRBC: 0 % (ref 0.0–0.2)

## 2024-10-31 LAB — COMPREHENSIVE METABOLIC PANEL WITH GFR
ALT: 22 U/L (ref 0–44)
AST: 33 U/L (ref 15–41)
Albumin: 4.5 g/dL (ref 3.5–5.0)
Alkaline Phosphatase: 68 U/L (ref 38–126)
Anion gap: 14 (ref 5–15)
BUN: 18 mg/dL (ref 8–23)
CO2: 25 mmol/L (ref 22–32)
Calcium: 9.3 mg/dL (ref 8.9–10.3)
Chloride: 103 mmol/L (ref 98–111)
Creatinine, Ser: 1.38 mg/dL — ABNORMAL HIGH (ref 0.44–1.00)
GFR, Estimated: 36 mL/min — ABNORMAL LOW (ref >60)
Glucose, Bld: 105 mg/dL — ABNORMAL HIGH (ref 70–99)
Potassium: 3.8 mmol/L (ref 3.5–5.1)
Sodium: 141 mmol/L (ref 135–145)
Total Bilirubin: 0.6 mg/dL (ref 0.0–1.2)
Total Protein: 7.4 g/dL (ref 6.5–8.1)

## 2024-10-31 LAB — DIFFERENTIAL
Abs Immature Granulocytes: 0.02 K/uL (ref 0.00–0.07)
Basophils Absolute: 0.1 K/uL (ref 0.0–0.1)
Basophils Relative: 1 %
Eosinophils Absolute: 0.2 K/uL (ref 0.0–0.5)
Eosinophils Relative: 3 %
Immature Granulocytes: 0 %
Lymphocytes Relative: 29 %
Lymphs Abs: 2 K/uL (ref 0.7–4.0)
Monocytes Absolute: 0.5 K/uL (ref 0.1–1.0)
Monocytes Relative: 7 %
Neutro Abs: 4.2 K/uL (ref 1.7–7.7)
Neutrophils Relative %: 60 %

## 2024-10-31 LAB — CBG MONITORING, ED: Glucose-Capillary: 96 mg/dL (ref 70–99)

## 2024-10-31 LAB — ETHANOL: Alcohol, Ethyl (B): <15 mg/dL (ref <15)

## 2024-10-31 LAB — PROTIME-INR
INR: 0.9 (ref 0.8–1.2)
Prothrombin Time: 12.3 s (ref 11.4–15.2)

## 2024-10-31 LAB — APTT: aPTT: 24 s (ref 24–36)

## 2024-10-31 LAB — GLUCOSE, CAPILLARY: Glucose-Capillary: 110 mg/dL — ABNORMAL HIGH (ref 70–99)

## 2024-10-31 MED ORDER — INSULIN ASPART 100 UNIT/ML IJ SOLN: 0.0000 [IU] | Freq: Three times a day (TID) | INTRAMUSCULAR | Status: DC

## 2024-10-31 MED ORDER — STROKE: EARLY STAGES OF RECOVERY BOOK: Freq: Once | Status: AC

## 2024-10-31 MED ORDER — ASPIRIN 81 MG PO CHEW
81.0000 mg | CHEWABLE_TABLET | Freq: Once | ORAL | Status: AC
  Administered 2024-10-31: 81 mg via ORAL
  Filled 2024-10-31: qty 1

## 2024-10-31 MED ORDER — ASPIRIN 81 MG PO CHEW: 81.0000 mg | CHEWABLE_TABLET | Freq: Every day | ORAL | Status: DC

## 2024-10-31 MED ORDER — AMIODARONE HCL IN DEXTROSE 360-4.14 MG/200ML-% IV SOLN
60.0000 mg/h | INTRAVENOUS | Status: DC
  Administered 2024-11-01 (×2): 60 mg/h via INTRAVENOUS
  Filled 2024-10-31 (×3): qty 200

## 2024-10-31 MED ORDER — AMIODARONE LOAD VIA INFUSION
150.0000 mg | Freq: Once | INTRAVENOUS | Status: AC
  Administered 2024-11-01: 150 mg via INTRAVENOUS
  Filled 2024-10-31: qty 83.34

## 2024-10-31 MED ORDER — CLOPIDOGREL BISULFATE 75 MG PO TABS: 75.0000 mg | ORAL_TABLET | Freq: Every day | ORAL | Status: DC

## 2024-10-31 MED ORDER — ACETAMINOPHEN 160 MG/5ML PO SOLN: 650.0000 mg | ORAL | Status: DC | PRN

## 2024-10-31 MED ORDER — AMIODARONE HCL IN DEXTROSE 360-4.14 MG/200ML-% IV SOLN
30.0000 mg/h | INTRAVENOUS | Status: DC
  Administered 2024-11-01 – 2024-11-02 (×3): 30 mg/h via INTRAVENOUS
  Filled 2024-10-31 (×3): qty 200

## 2024-10-31 MED ORDER — ACETAMINOPHEN 325 MG PO TABS
650.0000 mg | ORAL_TABLET | ORAL | Status: DC | PRN
Start: 2024-10-31 — End: 2024-11-02

## 2024-10-31 MED ORDER — HEPARIN (PORCINE) 25000 UT/250ML-% IV SOLN: 800.0000 [IU]/h | INTRAVENOUS | Status: DC

## 2024-10-31 MED ORDER — CLOPIDOGREL BISULFATE 75 MG PO TABS
75.0000 mg | ORAL_TABLET | Freq: Once | ORAL | Status: AC
  Administered 2024-10-31: 75 mg via ORAL
  Filled 2024-10-31: qty 1

## 2024-10-31 MED ORDER — IOHEXOL 350 MG/ML SOLN
60.0000 mL | Freq: Once | INTRAVENOUS | Status: AC | PRN
  Administered 2024-10-31: 60 mL via INTRAVENOUS

## 2024-10-31 MED ORDER — ACETAMINOPHEN 650 MG RE SUPP: 650.0000 mg | RECTAL | Status: DC | PRN

## 2024-10-31 MED ORDER — ENOXAPARIN SODIUM 30 MG/0.3ML IJ SOSY
30.0000 mg | PREFILLED_SYRINGE | INTRAMUSCULAR | Status: DC
  Administered 2024-10-31: 30 mg via SUBCUTANEOUS
  Filled 2024-10-31: qty 0.3

## 2024-10-31 NOTE — ED Provider Notes (Signed)
 [place] EMERGENCY DEPARTMENT AT [facility] Provider Note   CSN: [id number] Arrival date & time: 10/31/24  1259     Patient presents with: Stroke Symptoms   Melissa Kane is a 89 y.o. female.   HPI Presents with speech difficulty, swallowing difficulty, facial asymmetry. Last normal was yesterday though she does note that she was dizzy yesterday.  Today she awoke, and after several hours noticed facial asymmetry, and some difficulty swallowing tablets.  Subsequently while interacting with others she noticed speech difficulty.  No pain, no extremity weakness, no confusion.    Prior to Admission medications   Medication Sig Start Date End Date Taking? Authorizing Provider  amLODipine (NORVASC) 2.5 MG tablet Take 2.5 mg by mouth daily.    [provider]  atorvastatin (LIPITOR) 80 MG tablet Take 80 mg by mouth daily.    [provider]  Bisacodyl (WOMENS GENTLE LAXATIVE PO) Take 1 tablet by mouth at bedtime as needed.    [provider]  chlorpheniramine (CHLOR-TRIMETON) 4 MG tablet Take 1 tablet by mouth every evening. 07/25/17   [provider]  Coenzyme Q10 50 MG CAPS Take 1 capsule by mouth daily. 07/25/17   [provider]  diphenhydrAMINE (BENADRYL) 25 MG tablet Take 25 mg by mouth at bedtime.     [provider]  Ergocalciferol 50 MCG (2000 UT) CAPS Take 1 capsule by mouth daily.    [provider]  glimepiride (AMARYL) 1 MG tablet Take 1 mg by mouth daily. 04/02/20   [provider]  glipiZIDE (GLUCOTROL XL) 5 MG 24 hr tablet Take 5 mg by mouth 2 (two) times daily. 10/08/24   [provider]  levothyroxine (SYNTHROID) 25 MCG tablet Take 25 mcg by mouth daily. 05/07/20   [provider]  losartan-hydrochlorothiazide (HYZAAR) 50-12.5 MG per tablet Take 1 tablet by mouth daily.     [provider]  pantoprazole  (PROTONIX ) 20 MG tablet Take 1 tablet (20 mg total) by mouth daily.  05/08/20   Suzette Pac, MD  Turmeric 500 MG CAPS Take 1 capsule by mouth daily.    [provider]    Allergies: Patient has no known allergies.    Review of Systems  Updated Vital Signs BP (!) 189/81   Pulse 64   Temp 97.8 F (36.6 C)   Resp 18   Ht 1.651 m (5' 5)   Wt 78.5 kg   SpO2 95%   BMI 28.80 kg/m   Physical Exam Vitals and nursing note reviewed.  Constitutional:      General: She is not in acute distress.    Appearance: She is well-developed.  HENT:     Head: Normocephalic and atraumatic.  Eyes:     Conjunctiva/sclera: Conjunctivae normal.  Cardiovascular:     Rate and Rhythm: Normal rate and regular rhythm.  Pulmonary:     Effort: Pulmonary effort is normal. No respiratory distress.     Breath sounds: Normal breath sounds. No stridor.  Abdominal:     General: There is no distension.  Skin:    General: Skin is warm and dry.  Neurological:     Mental Status: She is alert and oriented to person, place, and time.     Cranial Nerves: Cranial nerve deficit, dysarthria and facial asymmetry present.     Motor: Atrophy present. No weakness, tremor or abnormal muscle tone.  Psychiatric:        Mood and Affect: Mood normal.     (  all labs ordered are listed, but only abnormal results are displayed) Labs Reviewed  COMPREHENSIVE METABOLIC PANEL WITH GFR - Abnormal; Notable for the following components:      Result Value   Glucose, Bld 105 (*)    Creatinine, Ser 1.38 (*)    GFR, Estimated 36 (*)    All other components within normal limits  PROTIME-INR  APTT  CBC  DIFFERENTIAL  ETHANOL  CBG MONITORING, ED    EKG: EKG Interpretation Date/Time:  Wednesday October 31 2024 13:18:21 EST Ventricular Rate:  71 PR Interval:  154 QRS Duration:  81 QT Interval:  368 QTC Calculation: 400 R Axis:   65  Text Interpretation: Sinus rhythm Atrial premature complexes in couplets Consider left atrial enlargement Abnormal R-wave progression, early  transition Borderline repolarization abnormality Confirmed by Garrick Charleston [phone]) on 10/31/2024 2:59:26 PM  Radiology: CT ANGIO HEAD NECK W WO CM Result Date: 10/31/2024 EXAM: CTA Head and Neck with Intravenous Contrast. CT Head without Contrast. CLINICAL HISTORY: Neuro deficit, acute, stroke suspected. TECHNIQUE: Axial CTA images of the head and neck performed with intravenous contrast. MIP reconstructed images were created and reviewed. Axial computed tomography images of the head/brain performed without intravenous contrast. Note: Per PQRS, the description of internal carotid artery percent stenosis, including 0 percent or normal exam, is based on [employer] American Symptomatic Carotid Endarterectomy Trial (NASCET) criteria. Dose reduction technique was used including one or more of the following: automated exposure control, adjustment of mA and kV according to patient size, and/or iterative reconstruction. CONTRAST: 60mL iohexol (OMNIPAQUE) 350 MG/ML injection. COMPARISON: None provided. FINDINGS: BRAIN: No acute intraparenchymal hemorrhage. No mass lesion. No acute infarct better characterized on same day MRI of the head. No midline shift or extra-axial collection. VENTRICLES: No hydrocephalus. ORBITS: The orbits are unremarkable. SINUSES AND MASTOIDS: The paranasal sinuses and mastoid air cells are clear. COMMON CAROTID ARTERIES: No significant stenosis. No dissection or occlusion. INTERNAL CAROTID ARTERIES: No stenosis by NASCET criteria. No dissection or occlusion. VERTEBRAL ARTERIES: No significant stenosis. No dissection or occlusion. ANTERIOR CEREBRAL ARTERIES: No significant stenosis. No occlusion. No aneurysm. MIDDLE CEREBRAL ARTERIES: No significant stenosis. No occlusion. No aneurysm. POSTERIOR CEREBRAL ARTERIES: No significant stenosis. No occlusion. No aneurysm. BASILAR ARTERY: No significant stenosis. No occlusion. No aneurysm. SOFT TISSUES: No acute finding. No masses or lymphadenopathy. BONES:  No acute osseous abnormality. IMPRESSION: 1. No large vessel occlusion or significant stenosis. Electronically signed by: Gilmore Molt MD 10/31/2024 03:24 PM EST RP Workstation: [id number]   MR BRAIN WO CONTRAST Result Date: 10/31/2024 EXAM: MRI BRAIN WITHOUT CONTRAST 10/31/2024 02:30:59 PM TECHNIQUE: Multiplanar multisequence MRI of the head/brain was performed without the administration of intravenous contrast. COMPARISON: None available. CLINICAL HISTORY: Neuro deficit, acute, stroke suspected Neuro deficit, acute, stroke suspected FINDINGS: BRAIN AND VENTRICLES: Acute right frontal cortical infarct. No intracranial hemorrhage. No mass. No midline shift. No hydrocephalus. Mild for age scattered T2/FLAIR hyperintensities in the white matter, compatible with chronic microvascular ischemic change. Normal flow voids. ORBITS: No acute abnormality. SINUSES AND MASTOIDS: No acute abnormality. BONES AND SOFT TISSUES: Normal marrow signal. No acute soft tissue abnormality. IMPRESSION: 1. Acute right frontal cortical infarct. Electronically signed by: Gilmore Molt MD 10/31/2024 03:06 PM EST RP Workstation: [id number]     Procedures   Medications Ordered in the ED  aspirin chewable tablet 81 mg (has no administration in time range)  clopidogrel (PLAVIX) tablet 75 mg (has no administration in time range)  iohexol (OMNIPAQUE) 350 MG/ML injection 60 mL (60 mLs  Intravenous Contrast Given 10/31/24 1436)                                    Medical Decision Making Adult female presents with signs and symptoms concerning for stroke, less likely facial nerve palsy.  Passage of hours since last seen normal places her outside of the window for TNK, initial NIH is low, will require repeat assessment.  Patient had stat CT, labs, imaging. Initial blood pressure elevated. Pulse ox 100% room air  Amount and/or Complexity of Data Reviewed External Data Reviewed: notes. Labs: ordered. Decision-making details  documented in ED Course. Radiology: ordered and independent interpretation performed. Decision-making details documented in ED Course. ECG/medicine tests: ordered and independent interpretation performed. Decision-making details documented in ED Course.  Risk OTC drugs. Prescription drug management. Decision regarding hospitalization. Diagnosis or treatment significantly limited by social determinants of health.   3:42 PM I discussed MRI results with the patient and 2 sons at bedside.  I also discussed patient's case with our neurologist, Dr. Matthews.  Patient has received aspirin, Plavix, is appropriate for any pain admission to hospitalist service with a.m. neurology consult. Patient is aware of all findings, amenable to staying for further evaluation.     Final diagnoses:  Acute CVA (cerebrovascular accident) [facility])    ED Discharge Orders     None          Garrick Charleston, MD 10/31/24 1542

## 2024-10-31 NOTE — Significant Event (Signed)
       CROSS COVER NOTE  NAME: Melissa Kane MRN: [id number] DOB : [birth year] ATTENDING PHYSICIAN: No att. providers found    Date of Service   10/31/2024   HPI/Events of Note   Cross cover service for [facility] at [facility]  Notified by nurse via secure chat variable heart rate 30s to 150  HPI patient admitted with right lower facial numbness and weakness, found to have acute right frontal cortical infarct and started on DTaP Past history includes diabetes, hyperlipidemia, HTN, hypothyroidism, HTN and GERD, and CKD3b folows with Dr Rachele   Interventions   Assessment/Plan: X X X

## 2024-10-31 NOTE — H&P (Signed)
 History and Physical    Patient: Melissa Kane FMW:[id number] DOB: 1931-09-01 DOA: 10/31/2024 DOS: the patient was seen and examined on 10/31/2024 PCP: Rosamond Leta NOVAK, MD  Patient coming from: Home  Chief Complaint:  Chief Complaint  Patient presents with   Stroke Symptoms   HPI: Melissa Kane is a 89 y.o. female with a history of HTN, T2DM, hypothyroidism, HLD, GERD who presented to the ED with abnormal speech, right lower facial numbness and weakness. When taking her AM pills she noticed water  flowing back out of the right side of her mouth. She noticed these changes upon waking this morning. She has had no gait abnormality and appears cognitively normal per family at bedside. She has noticed no changes to her symptoms since arrival in the ED. Workup revealed acute frontal CVA for which neurology was consulted and admission requested by hospitalist team.   Review of Systems: As mentioned in the history of present illness. All other systems reviewed and are negative. Past Medical History:  Diagnosis Date   Diabetes mellitus without complication (HCC)    Essential hypertension, benign    Hypercholesteremia    Hyperlipidemia    Impaired glucose tolerance    Thyroid  disease    Past Surgical History:  Procedure Laterality Date   APPENDECTOMY     COLONOSCOPY     Patient states she had in [place]/Dr.Rehman greater than 6 years,   COLONOSCOPY N/A 06/18/2015   Procedure: COLONOSCOPY;  Surgeon: Claudis RAYMOND Rivet, MD;  Location: [facility];  Service: Endoscopy;  Laterality: N/A;  830 -- in OR under fluoro   Excision left breast cyst     TUBAL LIGATION     Social History:  reports that she has quit smoking. Her smoking use included cigarettes. She has never used smokeless tobacco. She reports that she does not drink alcohol and does not use drugs.  No Known Allergies  Family History  Problem Relation Age of Onset   Cancer Father        Leukemia   Cancer Sister        Pancreatic   Cancer  Sister        Colon   COPD Sister     Prior to Admission medications   Medication Sig Start Date End Date Taking? Authorizing Provider  amLODipine (NORVASC) 2.5 MG tablet Take 2.5 mg by mouth at bedtime.   Yes [provider]  atorvastatin (LIPITOR) 80 MG tablet Take 80 mg by mouth at bedtime.   Yes [provider]  cholecalciferol (VITAMIN D3) 25 MCG (1000 UNIT) tablet Take 1,000 Units by mouth at bedtime.   Yes [provider]  Coenzyme Q10 50 MG CAPS Take 1 capsule by mouth at bedtime. 07/25/17  Yes [provider]  diphenhydrAMINE (BENADRYL) 25 MG tablet Take 25 mg by mouth at bedtime.    Yes [provider]  glipiZIDE (GLUCOTROL XL) 5 MG 24 hr tablet Take 5 mg by mouth 2 (two) times daily. 10/08/24  Yes [provider]  levothyroxine (SYNTHROID) 25 MCG tablet Take 25 mcg by mouth daily. 05/07/20  Yes [provider]  losartan-hydrochlorothiazide (HYZAAR) 50-12.5 MG per tablet Take 1 tablet by mouth at bedtime.   Yes [provider]  Omega-3 Fatty Acids (FISH OIL PO) Take 1 capsule by mouth at bedtime.   Yes [provider]  pantoprazole  (PROTONIX ) 20 MG tablet Take 1 tablet (20 mg total) by mouth daily. 05/08/20  Yes Suzette Pac, MD  TURMERIC  PO Take 1 tablet by mouth at bedtime.   Yes [provider]  vitamin B-12 (CYANOCOBALAMIN ) 100 MCG tablet Take 100 mcg by mouth at bedtime.   Yes [provider]    Physical Exam: Vitals:   10/31/24 1320 10/31/24 1330 10/31/24 1400 10/31/24 1727  BP: (!) 197/79 (!) 201/87 (!) 189/81   Pulse:   64   Resp:  17 18   Temp:    97.9 F (36.6 C)  TempSrc:    Oral  SpO2: 94%  95%   Weight:      Height:      Gen: Elderly well-appearing female in no distress Pulm: Clear, nonlabored  CV: RRR, no MRG or edema GI: Soft, NT, ND, +BS  Neuro: Alert and oriented, right lower facial weakness noted. No other focal deficits. Ext: Warm, no deformities Skin:  No rashes, lesions or ulcers on visualized skin   Data Reviewed: Glucose 105, Cr 1.38. CBC w/diff and CMP otherwise normal. INR 0.9. EtOH undetectable.  ECG (personal interpretation): NSR. No ectopy MR brain: Chronic microvascular disease that is mild for age, also acute right frontal infarct. CTA head/neck: No LVO  Assessment and Plan: Acute right frontal cortical infarct:  - Will continue DAPT - Echo ordered, cardiac monitor continued - Continue statin, check FLP, A1c - PT/OT/SLP - Formal neurology consultation in AM. Recommendations given.  HTN:  - Permissive HTN  HLD:  - Consider change statin depending on LDL  T2DM:  - SSI  Hypothyroidism:  - Continue synthroid   Advance Care Planning:   Code Status: Full Code confirmed at bedside  Consults: Neurology, Dr. Matthews.  Family Communication: 2 sons, 1 grandson at bedside in ED  Severity of Illness: The appropriate patient status for this patient is OBSERVATION. Observation status is judged to be reasonable and necessary in order to provide the required intensity of service to ensure the patient's safety. The patient's presenting symptoms, physical exam findings, and initial radiographic and laboratory data in the context of their medical condition is felt to place them at decreased risk for further clinical deterioration. Furthermore, it is anticipated that the patient will be medically stable for discharge from the hospital within 2 midnights of admission.   Author: Bernardino KATHEE Come, MD 10/31/2024 5:38 PM  For on call review [email or web address].

## 2024-10-31 NOTE — Progress Notes (Signed)
 PHARMACY - ANTICOAGULATION CONSULT NOTE  Pharmacy Consult for heparin Indication: atrial fibrillation in the setting of acute stroke  No Known Allergies  Patient Measurements: Height: 5' 5 (165.1 cm) Weight: 78.5 kg (173 lb 1 oz) IBW/kg (Calculated) : 57 HEPARIN DW (KG): 73.4  Vital Signs: Temp: 98.2 F (36.8 C) (11/05 2135) Temp Source: Oral (11/05 2135) BP: 143/53 (11/05 2135) Pulse Rate: 87 (11/05 2135)  Labs: Recent Labs    10/31/24 1313  HGB 13.6  HCT 39.0  PLT 220  APTT 24  LABPROT 12.3  INR 0.9  CREATININE 1.38*    Estimated Creatinine Clearance: 27.5 mL/min (A) (by C-G formula based on SCr of 1.38 mg/dL (H)).   Medical History: Past Medical History:  Diagnosis Date   Diabetes mellitus without complication (HCC)    Essential hypertension, benign    Hypercholesteremia    Hyperlipidemia    Impaired glucose tolerance    Thyroid  disease     Medications:  Medications Prior to Admission  Medication Sig Dispense Refill Last Dose/Taking   amLODipine (NORVASC) 2.5 MG tablet Take 2.5 mg by mouth at bedtime.   10/30/2024 Bedtime   atorvastatin (LIPITOR) 80 MG tablet Take 80 mg by mouth at bedtime.   10/30/2024 Bedtime   cholecalciferol (VITAMIN D3) 25 MCG (1000 UNIT) tablet Take 1,000 Units by mouth at bedtime.   10/30/2024 Bedtime   Coenzyme Q10 50 MG CAPS Take 1 capsule by mouth at bedtime.   10/30/2024 Bedtime   diphenhydrAMINE (BENADRYL) 25 MG tablet Take 25 mg by mouth at bedtime.    10/30/2024 Bedtime   glipiZIDE (GLUCOTROL XL) 5 MG 24 hr tablet Take 5 mg by mouth 2 (two) times daily.   10/31/2024 Morning   levothyroxine (SYNTHROID) 25 MCG tablet Take 25 mcg by mouth daily.   10/31/2024 Morning   losartan-hydrochlorothiazide (HYZAAR) 50-12.5 MG per tablet Take 1 tablet by mouth at bedtime.   10/30/2024 Bedtime   Omega-3 Fatty Acids (FISH OIL PO) Take 1 capsule by mouth at bedtime.   10/30/2024 Bedtime   pantoprazole  (PROTONIX ) 20 MG tablet Take 1 tablet (20 mg  total) by mouth daily. 30 tablet 0 10/31/2024 Morning   TURMERIC PO Take 1 tablet by mouth at bedtime.   10/30/2024 Bedtime   vitamin B-12 (CYANOCOBALAMIN ) 100 MCG tablet Take 100 mcg by mouth at bedtime.   10/30/2024 Bedtime   Scheduled:   [START ON 11/01/2024]  stroke: early stages of recovery book   Does not apply Once   [START ON 11/01/2024] aspirin  81 mg Oral Daily   [START ON 11/01/2024] clopidogrel  75 mg Oral Daily   enoxaparin (LOVENOX) injection  30 mg Subcutaneous Q24H   [START ON 11/01/2024] insulin aspart  0-6 Units Subcutaneous TID WC    Assessment: 89yo female presented to ED POV with facial droop, MRI reveals acute right frontal cortical infarct, found to be in Afib >> to begin heparin.  Goal of Therapy:  Heparin level 0.3-0.5 units/ml Monitor platelets by anticoagulation protocol: Yes   Plan:  Rec'd LMWH DVT Px ~2h ago. Start heparin infusion at 800 units/hr. Monitor heparin levels and CBC.  Marvetta Dauphin, PharmD, BCPS  10/31/2024,11:48 PM

## 2024-10-31 NOTE — Plan of Care (Signed)

## 2024-10-31 NOTE — ED Provider Notes (Signed)
  Physical Exam  BP (!) 189/81   Pulse 64   Temp 97.8 F (36.6 C)   Resp 18   Ht 5' 5 (1.651 m)   Wt 78.5 kg   SpO2 95%   BMI 28.80 kg/m   Physical Exam  Procedures  Procedures  ED Course / MDM   Clinical Course as of 10/31/24 1650  Wed Oct 31, 2024  1553 Assumed care. 89 yo F hx of HTN (no strokes) who woke up with dysarthria. LKW 8 pm last night. LVO negative. MRI with frontal infarct and CTA.  [RP]  16[address] Dw Dr Bryn from hospitalist will admit the patient. [RP]    Clinical Course User Index [RP] Yolande Lamar BROCKS, MD   Medical Decision Making Amount and/or Complexity of Data Reviewed Labs: ordered. Radiology: ordered.  Risk OTC drugs. Prescription drug management. Decision regarding hospitalization.       Yolande Lamar BROCKS, MD 10/31/24 [phone]

## 2024-10-31 NOTE — ED Triage Notes (Signed)
 Pt arrived via POV from home c/o right facial droop and reports drooling her beverages and food today. Pt LKW was last night. EDP called to Triage. Pt presents with no decreased sensation in her face at this time, has equal strength in all 4 extremities but does report her voice sounds abnormal to her today.

## 2024-11-01 ENCOUNTER — Telehealth (HOSPITAL_COMMUNITY): Payer: Self-pay | Admitting: Pharmacy Technician

## 2024-11-01 ENCOUNTER — Other Ambulatory Visit (HOSPITAL_COMMUNITY): Payer: Self-pay

## 2024-11-01 ENCOUNTER — Observation Stay (HOSPITAL_COMMUNITY)

## 2024-11-01 DIAGNOSIS — I480 Paroxysmal atrial fibrillation: Secondary | ICD-10-CM | POA: Diagnosis present

## 2024-11-01 DIAGNOSIS — E1122 Type 2 diabetes mellitus with diabetic chronic kidney disease: Secondary | ICD-10-CM | POA: Diagnosis present

## 2024-11-01 DIAGNOSIS — I10 Essential (primary) hypertension: Secondary | ICD-10-CM | POA: Diagnosis not present

## 2024-11-01 DIAGNOSIS — Z800 Family history of malignant neoplasm of digestive organs: Secondary | ICD-10-CM | POA: Diagnosis not present

## 2024-11-01 DIAGNOSIS — I4891 Unspecified atrial fibrillation: Secondary | ICD-10-CM

## 2024-11-01 DIAGNOSIS — E7800 Pure hypercholesterolemia, unspecified: Secondary | ICD-10-CM | POA: Diagnosis present

## 2024-11-01 DIAGNOSIS — I6340 Cerebral infarction due to embolism of unspecified cerebral artery: Secondary | ICD-10-CM

## 2024-11-01 DIAGNOSIS — E785 Hyperlipidemia, unspecified: Secondary | ICD-10-CM

## 2024-11-01 DIAGNOSIS — K219 Gastro-esophageal reflux disease without esophagitis: Secondary | ICD-10-CM | POA: Diagnosis present

## 2024-11-01 DIAGNOSIS — I6389 Other cerebral infarction: Secondary | ICD-10-CM

## 2024-11-01 DIAGNOSIS — Z79890 Hormone replacement therapy (postmenopausal): Secondary | ICD-10-CM | POA: Diagnosis not present

## 2024-11-01 DIAGNOSIS — Z825 Family history of asthma and other chronic lower respiratory diseases: Secondary | ICD-10-CM | POA: Diagnosis not present

## 2024-11-01 DIAGNOSIS — R29810 Facial weakness: Secondary | ICD-10-CM | POA: Diagnosis present

## 2024-11-01 DIAGNOSIS — I639 Cerebral infarction, unspecified: Secondary | ICD-10-CM | POA: Diagnosis present

## 2024-11-01 DIAGNOSIS — E039 Hypothyroidism, unspecified: Secondary | ICD-10-CM | POA: Diagnosis present

## 2024-11-01 DIAGNOSIS — R1310 Dysphagia, unspecified: Secondary | ICD-10-CM | POA: Diagnosis present

## 2024-11-01 DIAGNOSIS — I63421 Cerebral infarction due to embolism of right anterior cerebral artery: Secondary | ICD-10-CM | POA: Diagnosis present

## 2024-11-01 DIAGNOSIS — E1165 Type 2 diabetes mellitus with hyperglycemia: Secondary | ICD-10-CM | POA: Diagnosis present

## 2024-11-01 DIAGNOSIS — E079 Disorder of thyroid, unspecified: Secondary | ICD-10-CM | POA: Diagnosis present

## 2024-11-01 DIAGNOSIS — R001 Bradycardia, unspecified: Secondary | ICD-10-CM | POA: Diagnosis present

## 2024-11-01 DIAGNOSIS — Z87891 Personal history of nicotine dependence: Secondary | ICD-10-CM | POA: Diagnosis not present

## 2024-11-01 DIAGNOSIS — N1832 Chronic kidney disease, stage 3b: Secondary | ICD-10-CM | POA: Diagnosis present

## 2024-11-01 DIAGNOSIS — R29701 NIHSS score 1: Secondary | ICD-10-CM | POA: Diagnosis present

## 2024-11-01 DIAGNOSIS — Z806 Family history of leukemia: Secondary | ICD-10-CM | POA: Diagnosis not present

## 2024-11-01 DIAGNOSIS — Z79899 Other long term (current) drug therapy: Secondary | ICD-10-CM | POA: Diagnosis not present

## 2024-11-01 DIAGNOSIS — I129 Hypertensive chronic kidney disease with stage 1 through stage 4 chronic kidney disease, or unspecified chronic kidney disease: Secondary | ICD-10-CM | POA: Diagnosis present

## 2024-11-01 DIAGNOSIS — Z7984 Long term (current) use of oral hypoglycemic drugs: Secondary | ICD-10-CM | POA: Diagnosis not present

## 2024-11-01 LAB — CBC
HCT: 39.2 % (ref 36.0–46.0)
Hemoglobin: 13.7 g/dL (ref 12.0–15.0)
MCH: 33.3 pg (ref 26.0–34.0)
MCHC: 34.9 g/dL (ref 30.0–36.0)
MCV: 95.1 fL (ref 80.0–100.0)
Platelets: 221 K/uL (ref 150–400)
RBC: 4.12 MIL/uL (ref 3.87–5.11)
RDW: 12.6 % (ref 11.5–15.5)
WBC: 7.9 K/uL (ref 4.0–10.5)
nRBC: 0 % (ref 0.0–0.2)

## 2024-11-01 LAB — MAGNESIUM: Magnesium: 2 mg/dL (ref 1.7–2.4)

## 2024-11-01 LAB — MRSA NEXT GEN BY PCR, NASAL: MRSA by PCR Next Gen: NOT DETECTED

## 2024-11-01 LAB — ECHOCARDIOGRAM COMPLETE
AR max vel: 2.7 cm2
AV Area VTI: 2.9 cm2
AV Area mean vel: 2.63 cm2
AV Mean grad: 3.9 mmHg
AV Peak grad: 7.6 mmHg
Ao pk vel: 1.38 m/s
Area-P 1/2: 2.87 cm2
Calc EF: 62.2 %
Height: 65 in
MV VTI: 2.17 cm2
S' Lateral: 2.9 cm
Single Plane A2C EF: 71.8 %
Single Plane A4C EF: 56.1 %
Weight: 2768.98 [oz_av]

## 2024-11-01 LAB — GLUCOSE, CAPILLARY
Glucose-Capillary: 135 mg/dL — ABNORMAL HIGH (ref 70–99)
Glucose-Capillary: 116 mg/dL — ABNORMAL HIGH (ref 70–99)
Glucose-Capillary: 149 mg/dL — ABNORMAL HIGH (ref 70–99)
Glucose-Capillary: 127 mg/dL — ABNORMAL HIGH (ref 70–99)

## 2024-11-01 LAB — LIPID PANEL
Cholesterol: 156 mg/dL (ref 0–200)
HDL: 43 mg/dL (ref >40)
LDL Cholesterol: 94 mg/dL (ref 0–99)
Total CHOL/HDL Ratio: 3.7 ratio
Triglycerides: 99 mg/dL (ref <150)
VLDL: 20 mg/dL (ref 0–40)

## 2024-11-01 LAB — HEPARIN LEVEL (UNFRACTIONATED)
Heparin Unfractionated: >1.1 [IU]/mL — ABNORMAL HIGH (ref 0.30–0.70)
Heparin Unfractionated: 0.81 [IU]/mL — ABNORMAL HIGH (ref 0.30–0.70)

## 2024-11-01 LAB — HEMOGLOBIN A1C
Hgb A1c MFr Bld: 9.6 % — ABNORMAL HIGH (ref 4.8–5.6)
Mean Plasma Glucose: 228.82 mg/dL

## 2024-11-01 LAB — D-DIMER, QUANTITATIVE: D-Dimer, Quant: 0.52 ug{FEU}/mL — ABNORMAL HIGH (ref 0.00–0.50)

## 2024-11-01 LAB — TSH: TSH: 3.43 u[IU]/mL (ref 0.350–4.500)

## 2024-11-01 MED ORDER — MAGNESIUM GLUCONATE 500 (27 MG) MG PO TABS
500.0000 mg | ORAL_TABLET | ORAL | Status: DC
  Filled 2024-11-01: qty 1

## 2024-11-01 MED ORDER — CHLORHEXIDINE GLUCONATE CLOTH 2 % EX PADS
6.0000 | MEDICATED_PAD | Freq: Every day | CUTANEOUS | Status: DC
  Administered 2024-11-01 – 2024-11-02 (×2): 6 via TOPICAL

## 2024-11-01 MED ORDER — ORAL CARE MOUTH RINSE
15.0000 mL | OROMUCOSAL | Status: DC
  Administered 2024-11-01 – 2024-11-02 (×6): 15 mL via OROMUCOSAL

## 2024-11-01 MED ORDER — LIVING WELL WITH DIABETES BOOK: Freq: Once | Status: AC

## 2024-11-01 MED ORDER — HEPARIN (PORCINE) 25000 UT/250ML-% IV SOLN
450.0000 [IU]/h | INTRAVENOUS | Status: DC
  Administered 2024-11-01: 800 [IU]/h via INTRAVENOUS
  Filled 2024-11-01: qty 250

## 2024-11-01 MED ORDER — ROSUVASTATIN CALCIUM 10 MG PO TABS
10.0000 mg | ORAL_TABLET | Freq: Every day | ORAL | Status: DC
  Administered 2024-11-01: 10 mg via ORAL
  Filled 2024-11-01: qty 1

## 2024-11-01 MED ORDER — PERFLUTREN LIPID MICROSPHERE
1.0000 mL | INTRAVENOUS | Status: AC | PRN
  Administered 2024-11-01: 2 mL via INTRAVENOUS

## 2024-11-01 MED ORDER — ORAL CARE MOUTH RINSE
15.0000 mL | OROMUCOSAL | Status: DC | PRN
Start: 2024-11-01 — End: 2024-11-02

## 2024-11-01 MED ORDER — INSULIN STARTER KIT- PEN NEEDLES (ENGLISH)
1.0000 | Freq: Once | Status: AC
  Administered 2024-11-01: 1
  Filled 2024-11-01: qty 1

## 2024-11-01 MED ORDER — MELATONIN 3 MG PO TABS
6.0000 mg | ORAL_TABLET | ORAL | Status: AC
  Administered 2024-11-01: 6 mg via ORAL
  Filled 2024-11-01: qty 2

## 2024-11-01 MED ORDER — METOPROLOL TARTRATE 25 MG PO TABS
25.0000 mg | ORAL_TABLET | Freq: Two times a day (BID) | ORAL | Status: DC
  Administered 2024-11-01 (×2): 25 mg via ORAL
  Filled 2024-11-01 (×2): qty 1

## 2024-11-01 MED ORDER — ROSUVASTATIN CALCIUM 20 MG PO TABS
20.0000 mg | ORAL_TABLET | Freq: Every day | ORAL | Status: DC
  Administered 2024-11-02: 20 mg via ORAL
  Filled 2024-11-01: qty 1

## 2024-11-01 NOTE — Consult Note (Signed)
 I connected with  Melissa Kane on 11/01/24 by a video enabled telemedicine application and verified that I am speaking with the correct person using two identifiers.   I discussed the limitations of evaluation and management by telemedicine. The patient expressed understanding and agreed to proceed.  Location of patient: [facility] Location of physician: [facility]   Neurology Consultation Reason for Consult: Stroke Referring Physician: Dr. Lamar Salen  CC: Stroke  History is obtained from: Patient, chart review, family at bedside  HPI: Melissa Kane is a 89 y.o. female who presented yesterday with drooling, feeling numbness on the right side of her cheek and around the lips.  Reportedly she was feeling dizzy since 11//2025.  While in the hospital, patient was noted to be in A-fib.  Currently on heparin drip.  Cardiology has been consulted  Last known normal: 10/30/2024 No tPA or thrombectomy as outside window and no large vessel occlusion mRS 0 Event happened at home   ROS: All other systems reviewed and negative except as noted in the HPI.  Past Medical History:  Diagnosis Date   Diabetes mellitus without complication (HCC)    Essential hypertension, benign    Hypercholesteremia    Hyperlipidemia    Impaired glucose tolerance    Thyroid  disease     Family History  Problem Relation Age of Onset   Cancer Father        Leukemia   Cancer Sister        Pancreatic   Cancer Sister        Colon   COPD Sister     Social History:  reports that she has quit smoking. Her smoking use included cigarettes. She has never used smokeless tobacco. She reports that she does not drink alcohol and does not use drugs.  Medications Prior to Admission  Medication Sig Dispense Refill Last Dose/Taking   amLODipine (NORVASC) 2.5 MG tablet Take 2.5 mg by mouth at bedtime.   10/30/2024 Bedtime   atorvastatin (LIPITOR) 80 MG tablet Take 80 mg by mouth at bedtime.    10/30/2024 Bedtime   cholecalciferol (VITAMIN D3) 25 MCG (1000 UNIT) tablet Take 1,000 Units by mouth at bedtime.   10/30/2024 Bedtime   Coenzyme Q10 50 MG CAPS Take 1 capsule by mouth at bedtime.   10/30/2024 Bedtime   diphenhydrAMINE (BENADRYL) 25 MG tablet Take 25 mg by mouth at bedtime.    10/30/2024 Bedtime   glipiZIDE (GLUCOTROL XL) 5 MG 24 hr tablet Take 5 mg by mouth 2 (two) times daily.   10/31/2024 Morning   levothyroxine (SYNTHROID) 25 MCG tablet Take 25 mcg by mouth daily.   10/31/2024 Morning   losartan-hydrochlorothiazide (HYZAAR) 50-12.5 MG per tablet Take 1 tablet by mouth at bedtime.   10/30/2024 Bedtime   Omega-3 Fatty Acids (FISH OIL PO) Take 1 capsule by mouth at bedtime.   10/30/2024 Bedtime   pantoprazole  (PROTONIX ) 20 MG tablet Take 1 tablet (20 mg total) by mouth daily. 30 tablet 0 10/31/2024 Morning   TURMERIC PO Take 1 tablet by mouth at bedtime.   10/30/2024 Bedtime   vitamin B-12 (CYANOCOBALAMIN ) 100 MCG tablet Take 100 mcg by mouth at bedtime.   10/30/2024 Bedtime      Exam: Current vital signs: BP (!) 171/66   Pulse 64   Temp 97.9 F (36.6 C) (Oral)   Resp 13   Ht 5' 5 (1.651 m)   Wt 78.5 kg   SpO2 97%   BMI 28.80  kg/m  Vital signs in last 24 hours: Temp:  [97.6 F (36.4 C)-98.3 F (36.8 C)] 97.9 F (36.6 C) (11/06 1122) Pulse Rate:  [51-144] 64 (11/06 1100) Resp:  [13-22] 13 (11/06 1100) BP: (123-219)/(53-112) 171/66 (11/06 1100) SpO2:  [92 %-100 %] 97 % (11/06 1100) Weight:  [78.5 kg] 78.5 kg (11/05 1315)   Physical Exam  Constitutional: Appears well-developed and well-nourished.  Psych: Affect appropriate to situation Neuro: AO x 3, no aphasia, no dysarthria, cranial nerves grossly intact except reporting numbness on the right side of left, antigravity strength in all 4 extremities without drift, sensory intact to light touch, FTN intact bilaterally  NIHSS 1  INPUTS: 1A: Level of consciousness --> 0 = Alert; keenly responsive 1B: Ask month and age  --> 0 = Both questions right 1C: 'Blink eyes' & 'squeeze hands' --> 0 = Performs both tasks 2: Horizontal extraocular movements --> 0 = Normal 3: Visual fields --> 0 = No visual loss 4: Facial palsy --> 0 = Normal symmetry 5A: Left arm motor drift --> 0 = No drift for 10 seconds 5B: Right arm motor drift --> 0 = No drift for 10 seconds 6A: Left leg motor drift --> 0 = No drift for 5 seconds 6B: Right leg motor drift --> 0 = No drift for 5 seconds 7: Limb Ataxia --> 0 = No ataxia 8: Sensation --> 1 = Mild-moderate loss: less sharp/more dull 9: Language/aphasia --> 0 = Normal; no aphasia 10: Dysarthria --> 0 = Normal 11: Extinction/inattention --> 0 = No abnormality    I have reviewed labs in epic and the results pertinent to this consultation are: CBC:  Recent Labs  Lab 10/31/24 1313 11/01/24 0447  WBC 7.0 7.9  NEUTROABS 4.2  --   HGB 13.6 13.7  HCT 39.0 39.2  MCV 94.9 95.1  PLT 220 221    Basic Metabolic Panel:  Lab Results  Component Value Date   NA 141 10/31/2024   K 3.8 10/31/2024   CO2 25 10/31/2024   GLUCOSE 105 (H) 10/31/2024   BUN 18 10/31/2024   CREATININE 1.38 (H) 10/31/2024   CALCIUM 9.3 10/31/2024   GFRNONAA 36 (L) 10/31/2024   GFRAA 48 (L) 05/08/2020   Lipid Panel:  Lab Results  Component Value Date   LDLCALC 94 11/01/2024   HgbA1c:  Lab Results  Component Value Date   HGBA1C 9.6 (H) 10/31/2024   Urine Drug Screen: No results found for: LABOPIA, COCAINSCRNUR, LABBENZ, AMPHETMU, THCU, LABBARB  Alcohol Level     Component Value Date/Time   [facility] <15 10/31/2024 1313     I have reviewed the images obtained:  MRI brain without contrast 10/31/2024: Acute right frontal cortical infarct.   CTA head and neck with and without contrast 10/31/2024: No large vessel occlusion or significant stenosis  ASSESSMENT/PLAN: 89 year old female presented with numbness on right side of face and drooling.  While in the hospital went into A-fib.     Acute ischemic stroke New diagnosis of atrial fibrillation -Etiology of slow stroke: Likely cardioembolic - TTE pending -Patient currently on heparin drip.  The size of stroke is relatively small.  Therefore okay to continue as long as it is low-dose heparin protocol - If neurologically stable, OK to transition to p.o. Eliquis tomorrow ( pharmacy to dose) -LDL 94.  Therefore trend will increase rosuvastatin from 10 to 20 mg daily for goal LDL less than 70 - PT/OT -Goal blood pressure: Gradual normotension - Discussed plan with patient and  family at bedside Discussed plan with Dr. Bryn via secure chat    Thank you for allowing us  to participate in the care of this patient. If you have any further questions, please contact  me or neurohospitalist.   Arlin Krebs Epilepsy [facility]

## 2024-11-01 NOTE — Plan of Care (Signed)

## 2024-11-01 NOTE — TOC CM/SW Note (Signed)
 Transition of Care [facility]) - Inpatient Brief Assessment   Patient Details  Name: Melissa Kane MRN: [id number] Date of Birth: 11-27-1931  [facility] CM/SW Contact:    Noreen KATHEE Pinal, LCSWA Phone Number: 11/01/2024, 10:27 AM   Clinical Narrative:  Inpatient Care Management (ICM) has reviewed patient and no other ICM needs have been identified at this time. We will continue to monitor patient advancement through interdisciplinary progression rounds. If new patient transition needs arise, please place a ICM consult.  Transition of Care Asessment: Insurance and Status: Insurance coverage has been reviewed Patient has primary care physician: Yes Home environment has been reviewed: Single Family Home Prior level of function:: Independent Prior/Current Home Services: No current home services Social Drivers of Health Review: SDOH reviewed no interventions necessary Readmission risk has been reviewed: Yes Transition of care needs: no transition of care needs at this time

## 2024-11-01 NOTE — Evaluation (Signed)
 Physical Therapy Evaluation Patient Details Name: Melissa Kane MRN: [id number] DOB: [birth year] Today's Date: 11/01/2024  History of Present Illness  Melissa Kane is a 89 y.o. female with a history of HTN, T2DM, hypothyroidism, HLD, GERD who presented to the ED with abnormal speech, right lower facial numbness and weakness. When taking her AM pills she noticed water  flowing back out of the right side of her mouth. She noticed these changes upon waking this morning. She has had no gait abnormality and appears cognitively normal per family at bedside. She has noticed no changes to her symptoms since arrival in the ED. Workup revealed acute frontal CVA for which neurology was consulted and admission requested by hospitalist team.   Clinical Impression  Pt. Presented with mild general weakness secondary to Acute CVA. Pt. Tolerated session well . Pt was able to perform bed mobility, transfers from bed to chair, and ambulate w/o AD and with Independence/ Mod Independence. Pt. Was left in chair with call-bell, and nursing staff was notified on pt status. Plan:  Patient discharged from physical therapy to care of nursing for ambulation daily as tolerated for length of stay.       If plan is discharge home, recommend the following: A little help with walking and/or transfers   Can travel by private vehicle    Yes    Equipment Recommendations None recommended by PT  Recommendations for Other Services       Functional Status Assessment Patient has had a recent decline in their functional status and demonstrates the ability to make significant improvements in function in a reasonable and predictable amount of time.     Precautions / Restrictions Precautions Precautions: Fall Recall of Precautions/Restrictions: Intact Restrictions Weight Bearing Restrictions Per Provider Order: No      Mobility  Bed Mobility Overal bed mobility: Independent, Modified Independent             General bed  mobility comments: Labored to EOB    Transfers Overall transfer level: Independent Equipment used: None               General transfer comment: WFL    Ambulation/Gait Ambulation/Gait assistance: Independent Gait Distance (Feet): 150 Feet Assistive device: None Gait Pattern/deviations: Drifts right/left, Decreased step length - right, Decreased step length - left, Decreased stride length, Decreased stance time - right, Decreased stance time - left Gait velocity: normal     General Gait Details: Pt. was able to ambulate w/o AD, pt did drift off slight with ambulation, but was able to recover.  Stairs            Wheelchair Mobility     Tilt Bed    Modified Rankin (Stroke Patients Only)       Balance Overall balance assessment: Independent, Modified Independent                                           Pertinent Vitals/Pain Pain Assessment Pain Assessment: No/denies pain    Home Living Family/patient expects to be discharged to:: Private residence Living Arrangements: Spouse/significant other Available Help at Discharge: Family Type of Home: House Home Access: Stairs to enter Entrance Stairs-Rails: Right;Can reach both Secretary/administrator of Steps: 10-12   Home Layout: One level Home Equipment: Cane - single point;Grab bars - toilet;Grab bars - tub/shower      Prior Function Prior Level of Function :  Independent/Modified Independent;Driving;Working/employed             Mobility Comments: WFL, pt is able to walk w/o AD ADLs Comments: [facility]     Extremity/Trunk Assessment        Lower Extremity Assessment Lower Extremity Assessment: Generalized weakness;RLE deficits/detail;LLE deficits/detail RLE Deficits / Details: Hip flexion 5/5, ABD/ ADD 4+/5, Knee flexion 4/5, and Knee extension 4+/5 RLE Sensation: WNL RLE Coordination: WNL LLE Deficits / Details: Hip flexion 5/5, ABD/ ADD 4+/5, Knee flexion 4+/5, and Knee  extension 4+/5 LLE Sensation: WNL LLE Coordination: WNL    Cervical / Trunk Assessment Cervical / Trunk Assessment: Normal  Communication   Communication Communication: No apparent difficulties Factors Affecting Communication: Hearing impaired    Cognition Arousal: Alert Behavior During Therapy: WFL for tasks assessed/performed   PT - Cognitive impairments: No apparent impairments                         Following commands: Intact       Cueing Cueing Techniques: Verbal cues     General Comments      Exercises     Assessment/Plan    PT Assessment Patient does not need any further PT services  PT Problem List         PT Treatment Interventions      PT Goals (Current goals can be found in the Care Plan section)  Acute Rehab PT Goals Patient Stated Goal: pt. wants to return home PT Goal Formulation: With patient Time For Goal Achievement: 11/03/24 Potential to Achieve Goals: Good    Frequency       Co-evaluation PT/OT/SLP Co-Evaluation/Treatment: Yes Reason for Co-Treatment: For patient/therapist safety;To address functional/ADL transfers PT goals addressed during session: Mobility/safety with mobility OT goals addressed during session: ADL's and self-care       AM-PAC PT 6 Clicks Mobility  Outcome Measure Help needed turning from your back to your side while in a flat bed without using bedrails?: None Help needed moving from lying on your back to sitting on the side of a flat bed without using bedrails?: None Help needed moving to and from a bed to a chair (including a wheelchair)?: None Help needed standing up from a chair using your arms (e.g., wheelchair or bedside chair)?: None Help needed to walk in hospital room?: None Help needed climbing 3-5 steps with a railing? : A Little 6 Click Score: 23    End of Session   Activity Tolerance: Patient tolerated treatment well Patient left: in chair;with call bell/phone within reach Nurse  Communication: Mobility status PT Visit Diagnosis: Muscle weakness (generalized) (M62.81);History of falling (Z91.81)    Time: 9185-9165 PT Time Calculation (min) (ACUTE ONLY): 20 min   Charges:   PT Evaluation $PT Eval Moderate Complexity: 1 Mod PT Treatments $Therapeutic Activity: 8-22 mins PT General Charges $$ ACUTE PT VISIT: 1 Visit         Tyhesha Dutson, SPT

## 2024-11-01 NOTE — Progress Notes (Signed)
 [facility] PROGRESS NOTE  Melissa Kane (DOB: 29-Apr-1931) FMW:[id number] PCP: Melissa Leta NOVAK, MD  Brief Narrative: Melissa Kane is a 89 y.o. female with a history of HTN, T2DM, hypothyroidism, HLD, GERD who presented to the ED with abnormal speech, right lower facial numbness and weakness. MRI revealed a small cortical frontal infarct for which she was admitted. Developed AFib (new diagnosis) with RVR overnight, so transferred to SDU and started on low dose heparin, amiodarone. Neurology and cardiology consultations pending.   Subjective: Had palpitations last night like she has had at night at home intermittently. No chest pain or new numbness, weakness, or other concerns.   Objective: BP (!) 123/95   Pulse (!) 57   Temp 98 F (36.7 C) (Oral)   Resp 20   Ht 5' 5 (1.651 m)   Wt 78.5 kg   SpO2 99%   BMI 28.80 kg/m   Gen: Elderly but well-appearing female in no distress Pulm: Clear, nonlabored  CV: Irreg irreg this AM with rate in 110's, no MRG, no pitting edema GI: Soft, NT, ND, +BS  Neuro: Alert and oriented. No new focal deficits. Ext: Warm, no deformities. Skin: No rashes, lesions or ulcers on visualized skin   Assessment & Plan: Acute right frontal cortical infarct: Now suspicion for embolic phenomenon given new AFib and previous palpitations.  - Change DAPT to heparin. Discussed with Dr. Shelton who will formally consult. Low therapeutic goal on heparin, continue neuro checks.  - Echo ordered, cardiac monitor continued - Continue statin, LDL is not at goal, will change atorvastatin > rosuvastatin dosed for CrCl < 59ml/min.  - PT/OT/SLP  New diagnosis PAF with RVR:  - Was initiated on amiodarone, possibly to allow for permissive HTN, though still with elevated rate, will add metoprolol and monitor in SDU. Likely can stop amiodarone. Would appreciate cardiology evaluation. Echo pending as above.  - Low dose heparin as discussed above   HTN:  - Permissive HTN, holding  home antihypertensives for today.    HLD:  - Change atorva > rosuva and recheck in outpatient setting.    T2DM: Uncontrolled with hyperglycemia, HbA1c 9.6%. Was recently started on [facility], had been off basal insulin for a year or so. Was thought to have improved control with weight loss.  - Will continue SSI, with metformin and O[facility] not good options, needing to bring A1c down more than 2.0%, will plan to restart on at least basal insulin, will consult DM coordinator.    Hypothyroidism:  - Continue synthroid  Stage IIIb CKD:  - Avoid nephrotoxins.   Melissa Kane Come, MD [facility] [email or web address] 11/01/2024, 10:25 AM

## 2024-11-01 NOTE — Telephone Encounter (Signed)
 Patient Product/process Development Scientist completed.    The patient is insured through [employer]. Patient has Medicare and is not eligible for a copay card, but may be able to apply for patient assistance or Medicare RX Payment Plan (Patient Must reach out to their plan, if eligible for payment plan), if available.    Ran test claim for Lantus Pen and the current 30 day co-pay is $35.00.  Ran test claim for Dexcom G7 Sensor and Requires Prior Authorization  Ran test claim for [employer] 3 Plus Sensor and Requires Prior Authorization  This test claim was processed through [employer]- copay amounts may vary at other pharmacies due to [employer], or as the patient moves through the different stages of their insurance plan.     Reyes Sharps, CPHT Pharmacy Technician Patient Advocate Specialist Lead [facility] Patient Advocate Team Direct Number: [phone]  Fax: [phone]

## 2024-11-01 NOTE — Evaluation (Signed)
 Speech Language Pathology Evaluation Patient Details Name: Melissa Kane MRN: [id number] DOB: [birth year] Today's Date: 11/01/2024 Time: 8574-8541 SLP Time Calculation (min) (ACUTE ONLY): 33 min  Problem List:  Patient Active Problem List   Diagnosis Date Noted   Acute CVA (cerebrovascular accident) (HCC) 10/31/2024   Shortness of breath 03/14/2012   Abnormal EKG 03/14/2012   Carotid bruit 03/14/2012   Essential hypertension, benign 03/14/2012   Mixed hyperlipidemia 03/14/2012   Impaired glucose tolerance 03/14/2012   Past Medical History:  Past Medical History:  Diagnosis Date   Diabetes mellitus without complication (HCC)    Essential hypertension, benign    Hypercholesteremia    Hyperlipidemia    Impaired glucose tolerance    Thyroid  disease    Past Surgical History:  Past Surgical History:  Procedure Laterality Date   APPENDECTOMY     COLONOSCOPY     Patient states she had in [place]/Dr.Rehman greater than 6 years,   COLONOSCOPY N/A 06/18/2015   Procedure: COLONOSCOPY;  Surgeon: Claudis RAYMOND Rivet, MD;  Location: [facility];  Service: Endoscopy;  Laterality: N/A;  830 -- in OR under fluoro   Excision left breast cyst     TUBAL LIGATION     HPI:  89 year old female presented with numbness on right side of face and drooling.  While in the hospital went into A-fib.       Acute ischemic stroke  New diagnosis of atrial fibrillation  -Etiology of slow stroke: Likely cardioembolic  - TTE pending. SLE requested.   Assessment / Plan / Recommendation Clinical Impression  Pt is a 89 yo female who was admitted with a right frontal stroke and lives alone and was independent in all aspects prior to admission. She initally noted mild slurred speech, however she feels it has resolved. Pt demonstrates WNL cognitive/linguistic skills with only 3 distortions of sounds in multisyllabic words over the course of the evaluation. Pt notes mild reduction in sensation around the right side of her  lip, but reports improvement. She was able to name 21 animals in one minute and recall 3/4 words in delayed recall task (4/4 with cue). She is eager to return home. No further SLP services indicated at this time, will sign off.    SLP Assessment  SLP Recommendation/Assessment: Patient does not need any further Speech Language Pathology Services SLP Visit Diagnosis: Cognitive communication deficit (R41.841)     Assistance Recommended at Discharge  None  Functional Status Assessment Patient has not had a recent decline in their functional status  Frequency and Duration           SLP Evaluation Cognition  Overall Cognitive Status: Within Functional Limits for tasks assessed Arousal/Alertness: Awake/alert Orientation Level: Oriented X4 Year: 2025 Month: November Day of Week: Correct Memory: Appears intact (3/4 words recalled after 8 minutes (4/4 with cue)) Awareness: Appears intact Problem Solving: Appears intact Safety/Judgment: Appears intact       Comprehension  Auditory Comprehension Overall Auditory Comprehension: Appears within functional limits for tasks assessed Yes/No Questions: Within Functional Limits Commands: Within Functional Limits Conversation: Complex Visual Recognition/Discrimination Discrimination: Within Function Limits Reading Comprehension Reading Status: Not tested    Expression Expression Primary Mode of Expression: Verbal Verbal Expression Overall Verbal Expression: Appears within functional limits for tasks assessed Initiation: No impairment Automatic Speech: Name;Social Response Level of Generative/Spontaneous Verbalization: Conversation Repetition: No impairment Naming: No impairment (named 21 animals in one minute) Pragmatics: No impairment Non-Verbal Means of Communication: Not applicable Written Expression Dominant Hand: Right Written  Expression: Not tested   Oral / Motor  Oral Motor/Sensory Function Overall Oral Motor/Sensory  Function: Mild impairment (mildly impaired sensation around obicularis area on the right) Facial ROM: Within Functional Limits Facial Symmetry: Within Functional Limits Facial Strength: Reduced right (mildly reduced with repetitive protrusion/retraction) Facial Sensation: Reduced right Lingual ROM: Within Functional Limits Lingual Symmetry: Within Functional Limits Lingual Strength: Within Functional Limits Lingual Sensation: Within Functional Limits Velum: Within Functional Limits Mandible: Within Functional Limits Motor Speech Overall Motor Speech: Appears within functional limits for tasks assessed Respiration: Within functional limits Phonation: Normal Resonance: Within functional limits Articulation: Within functional limitis Intelligibility: Intelligible Motor Planning: Within functional limits Motor Speech Errors: Not applicable Effective Techniques:  (only occasional mild distortions noted with multisyllabic words)           Thank you,  Lamar Candy, CCC-SLP [phone]  [name] 11/01/2024, 3:46 PM

## 2024-11-01 NOTE — Progress Notes (Incomplete)
 Attending Note   Patient seen and discussed with PA Johnson, I agree with her documentation. 89 yo female history of HTN, DM2, HLD, hypothryoidism, GERD presentd with right lower facial numbness/weakness and abnormal speech. Stroke evaluation by primary. On admission noted to be in afib, a new diagnosis for the patient. Cardiology consulted to help manage     WBC 7 Hgb 13.6 Plt 220 K 3.8 Cr 1.38 BUN 18 GFR 36 A1c 9.6 Mg 2 TSH 3.4 Ddimer 0.52  EKG SR, PACs Echo pending MRI brain: Acute right frontal cortical infarct.  CTA head and neck: No large vessel occlusion or significant stenosis.    1.Afib - new diagnosis this admission. In setting of CVA this admission possibly afib related - started on IV amiodarone by overnight team to allow for ongoing permissive hypertension given CVA - can continue amiodarone for time being, likely overall short course would be planned  - hep gtt if ok with neurology, also transition to DOAC later in admission if ok with neurology

## 2024-11-01 NOTE — Progress Notes (Signed)
 PHARMACY - ANTICOAGULATION CONSULT NOTE  Pharmacy Consult for heparin Indication: atrial fibrillation in the setting of acute stroke  No Known Allergies  Patient Measurements: Height: 5' 5 (165.1 cm) Weight: 78.5 kg (173 lb 1 oz) IBW/kg (Calculated) : 57 HEPARIN DW (KG): 73.4  Vital Signs: Temp: 98 F (36.7 C) (11/06 0739) Temp Source: Oral (11/06 0739) BP: 166/71 (11/06 1030) Pulse Rate: 51 (11/06 1030)  Labs: Recent Labs    10/31/24 1313 11/01/24 0447 11/01/24 0849  HGB 13.6 13.7  --   HCT 39.0 39.2  --   PLT 220 221  --   APTT 24  --   --   LABPROT 12.3  --   --   INR 0.9  --   --   HEPARINUNFRC  --   --  >1.10*  CREATININE 1.38*  --   --     Estimated Creatinine Clearance: 27.5 mL/min (A) (by C-G formula based on SCr of 1.38 mg/dL (H)).   Medical History: Past Medical History:  Diagnosis Date   Diabetes mellitus without complication (HCC)    Essential hypertension, benign    Hypercholesteremia    Hyperlipidemia    Impaired glucose tolerance    Thyroid  disease     Medications:  Medications Prior to Admission  Medication Sig Dispense Refill Last Dose/Taking   amLODipine (NORVASC) 2.5 MG tablet Take 2.5 mg by mouth at bedtime.   10/30/2024 Bedtime   atorvastatin (LIPITOR) 80 MG tablet Take 80 mg by mouth at bedtime.   10/30/2024 Bedtime   cholecalciferol (VITAMIN D3) 25 MCG (1000 UNIT) tablet Take 1,000 Units by mouth at bedtime.   10/30/2024 Bedtime   Coenzyme Q10 50 MG CAPS Take 1 capsule by mouth at bedtime.   10/30/2024 Bedtime   diphenhydrAMINE (BENADRYL) 25 MG tablet Take 25 mg by mouth at bedtime.    10/30/2024 Bedtime   glipiZIDE (GLUCOTROL XL) 5 MG 24 hr tablet Take 5 mg by mouth 2 (two) times daily.   10/31/2024 Morning   levothyroxine (SYNTHROID) 25 MCG tablet Take 25 mcg by mouth daily.   10/31/2024 Morning   losartan-hydrochlorothiazide (HYZAAR) 50-12.5 MG per tablet Take 1 tablet by mouth at bedtime.   10/30/2024 Bedtime   Omega-3 Fatty Acids (FISH  OIL PO) Take 1 capsule by mouth at bedtime.   10/30/2024 Bedtime   pantoprazole  (PROTONIX ) 20 MG tablet Take 1 tablet (20 mg total) by mouth daily. 30 tablet 0 10/31/2024 Morning   TURMERIC PO Take 1 tablet by mouth at bedtime.   10/30/2024 Bedtime   vitamin B-12 (CYANOCOBALAMIN ) 100 MCG tablet Take 100 mcg by mouth at bedtime.   10/30/2024 Bedtime   Scheduled:   Chlorhexidine Gluconate Cloth  6 each Topical Q0600   insulin aspart  0-6 Units Subcutaneous TID WC   metoprolol tartrate  25 mg Oral BID   mouth rinse  15 mL Mouth Rinse 4 times per day   rosuvastatin  10 mg Oral Daily    Assessment: 89yo female presented to ED POV with facial droop, MRI reveals acute right frontal cortical infarct, found to be in Afib >> to begin heparin.  HL >1.10- no issues noted per RN but unexpected high result based on rate CBC WNL D-Dimer 0.52  Goal of Therapy:  Heparin level 0.3-0.5 units/ml Monitor platelets by anticoagulation protocol: Yes   Plan:  Hold heparin x 1 hour Decrease  heparin infusion to 600 units/hr. Monitor heparin levels and CBC.  Elspeth Sour, PharmD Clinical Pharmacist 11/01/2024 10:48  AM

## 2024-11-01 NOTE — Progress Notes (Signed)
 OT Cancellation Note  Patient Details Name: MARINE LEZOTTE MRN: [id number] DOB: 07-Sep-1931   Cancelled Treatment:    Reason Eval/Treat Not Completed: OT screened, no needs identified, will sign off. Pt screened for OT needs, pt independent in bed mobility, functional mobility, and ADL tasks. No further OT needs at this time.    Sonny Cory, OTR/L  [phone] 11/01/2024, 10:12 AM

## 2024-11-01 NOTE — Consult Note (Addendum)
 Cardiology Consultation   Patient ID: Melissa Kane MRN: [id number]; DOB: [birth year]  Admit date: 10/31/2024 Date of Consult: 11/01/2024  PCP:  Rosamond Leta NOVAK, MD   [facility] Providers Cardiologist:  New to [facility]  Patient Profile: Melissa Kane is a 89 y.o. female with a hx of HTN, HLD, Type II DM and hypothyroidism who is being seen 11/01/2024 for the evaluation of atrial fibrillation with RVR at the request of Dr. Bryn.  History of Present Illness: Melissa Kane presented to [facility] on 10/31/2024 for evaluation of facial asymmetry and difficulty speaking.  Also had difficulty taking her medications.   Initial labs showed WBC 7.0, Hgb 13.6, platelets 220, Na+ 141, K+ 3.8 and creatinine 1.38.  Hemoglobin A1c 9.6.  Magnesium 2.0.  TSH 3.430.  FLP shows total cholesterol 156, triglycerides 99, HDL 43 and LDL 94.  EKG on admission showed normal sinus rhythm, heart rate 71 with PAC's. Brain MRI showed an acute right frontal cortical infarct.  CTA neck showed no large vessel occlusion or significant stenosis.  Echocardiogram ordered and is pending for today.  She was admitted for further workup of her CVA and initially started on DAPT with ASA and Plavix. Overnight, she was noted to have gone into atrial fibrillation with RVR and was started on IV Amiodarone along with IV Heparin. It does not appear that she received AV nodal blocking agents overnight but has been started on Lopressor 25 mg twice daily this morning. Her initial episode of atrial fibrillation lasted until 0130 and she converted back to normal sinus rhythm with heart rate in the 70's. Had recurrence around 0630 this morning and back into normal sinus rhythm by 0930 with heart rate in the 70's.  In talking with the patient today, she reports she is very active at baseline for her age and lives by herself and performs ADL's independently. She has recently been doing yard work without any symptoms.  Says that yesterday,  she awoke around 0630 and was taking her Synthroid medication and noticed that water  was dripping off her face. She continued to do routine chores around the house but later came to the ED for assessment after talking with her PCP. She denies any palpitations last night but does report having intermittent palpitations at home which are typically most notable at night. No associated chest pain or shortness of breath. No recent orthopnea, PND or pitting edema. No recent falls. No known personal history of cardiac issues and no known family history of atrial fibrillation. Reports her son takes cardiac medications but she is unsure exactly what these are indicated for.   Past Medical History:  Diagnosis Date   Diabetes mellitus without complication (HCC)    Essential hypertension, benign    Hypercholesteremia    Hyperlipidemia    Impaired glucose tolerance    Thyroid  disease     Past Surgical History:  Procedure Laterality Date   APPENDECTOMY     COLONOSCOPY     Patient states she had in [place]/Dr.Rehman greater than 6 years,   COLONOSCOPY N/A 06/18/2015   Procedure: COLONOSCOPY;  Surgeon: Claudis RAYMOND Rivet, MD;  Location: [facility];  Service: Endoscopy;  Laterality: N/A;  830 -- in OR under fluoro   Excision left breast cyst     TUBAL LIGATION       Home Medications:  Prior to Admission medications   Medication Sig Start Date End Date Taking? Authorizing Provider  amLODipine (NORVASC) 2.5 MG tablet Take  2.5 mg by mouth at bedtime.   Yes [provider]  atorvastatin (LIPITOR) 80 MG tablet Take 80 mg by mouth at bedtime.   Yes [provider]  cholecalciferol (VITAMIN D3) 25 MCG (1000 UNIT) tablet Take 1,000 Units by mouth at bedtime.   Yes [provider]  Coenzyme Q10 50 MG CAPS Take 1 capsule by mouth at bedtime. 07/25/17  Yes [provider]  diphenhydrAMINE (BENADRYL) 25 MG tablet Take 25 mg by mouth at bedtime.    Yes [provider]   glipiZIDE (GLUCOTROL XL) 5 MG 24 hr tablet Take 5 mg by mouth 2 (two) times daily. 10/08/24  Yes [provider]  levothyroxine (SYNTHROID) 25 MCG tablet Take 25 mcg by mouth daily. 05/07/20  Yes [provider]  losartan-hydrochlorothiazide (HYZAAR) 50-12.5 MG per tablet Take 1 tablet by mouth at bedtime.   Yes [provider]  Omega-3 Fatty Acids (FISH OIL PO) Take 1 capsule by mouth at bedtime.   Yes [provider]  pantoprazole  (PROTONIX ) 20 MG tablet Take 1 tablet (20 mg total) by mouth daily. 05/08/20  Yes Zammit, Joseph, MD  TURMERIC PO Take 1 tablet by mouth at bedtime.   Yes [provider]  vitamin B-12 (CYANOCOBALAMIN ) 100 MCG tablet Take 100 mcg by mouth at bedtime.   Yes [provider]    Scheduled Meds:  Chlorhexidine Gluconate Cloth  6 each Topical Q0600   insulin aspart  0-6 Units Subcutaneous TID WC   metoprolol tartrate  25 mg Oral BID   mouth rinse  15 mL Mouth Rinse 4 times per day   rosuvastatin  10 mg Oral Daily   Continuous Infusions:  amiodarone 30 mg/hr (11/01/24 1110)   heparin 600 Units/hr (11/01/24 1152)   PRN Meds: acetaminophen **OR** acetaminophen (TYLENOL) oral liquid 160 mg/5 mL **OR** acetaminophen, mouth rinse, perflutren lipid microspheres (DEFINITY) IV suspension  Allergies:   No Known Allergies  Social History:   Social History   Socioeconomic History   Marital status: Married    Spouse name: Not on file   Number of children: Not on file   Years of education: Not on file   Highest education level: Not on file  Occupational History   Occupation: Public Affairs Consultant: RETIRED  Tobacco Use   Smoking status: Former    Types: Cigarettes   Smokeless tobacco: Never  Vaping Use   Vaping status: Never Used  Substance and Sexual Activity   Alcohol use: No    Alcohol/week: 0.0 standard drinks of alcohol   Drug use: No   Sexual activity: Not Currently  Other Topics Concern   Not on file   Social History Narrative   Not on file    Family History:    Family History  Problem Relation Age of Onset   Cancer Father        Leukemia   Cancer Sister        Pancreatic   Cancer Sister        Colon   COPD Sister      ROS:  Please see the history of present illness.   All other ROS reviewed and negative.     Physical Exam/Data: Vitals:   11/01/24 1000 11/01/24 1030 11/01/24 1100 11/01/24 1122  BP: (!) 123/95 (!) 166/71 (!) 171/66   Pulse: (!) 57 (!) 51 64   Resp: 20 15 13    Temp:    97.9 F (36.6 C)  TempSrc:  Oral  SpO2: 99% 97% 97%   Weight:      Height:        Intake/Output Summary (Last 24 hours) at 11/01/2024 1127 Last data filed at 11/01/2024 1110 Gross per 24 hour  Intake 412.56 ml  Output --  Net 412.56 ml      10/31/2024    1:15 PM 05/08/2020    4:52 PM 06/18/2015    7:33 AM  Last 3 Weights  Weight (lbs) 173 lb 1 oz 173 lb 180 lb  Weight (kg) 78.5 kg 78.472 kg 81.647 kg     Body mass index is 28.8 kg/m.  General: Pleasant, elderly female appearing in no acute distress HEENT: normal Neck: no JVD Vascular: No carotid bruits; Distal pulses 2+ bilaterally Cardiac:  normal S1, S2; RRR; no murmur  Lungs:  clear to auscultation bilaterally, no wheezing, rhonchi or rales  Abd: soft, nontender, no hepatomegaly  Ext: no pitting edema Musculoskeletal:  No deformities, BUE and BLE strength normal and equal Skin: warm and dry  Neuro:  CNs 2-12 intact, no focal abnormalities noted Psych:  Normal affect   EKG:  The EKG was personally reviewed and demonstrates: Normal sinus rhythm, heart rate 71 with PAC's.  Relevant CV Studies:  Echocardiogram: Pending  Laboratory Data: High Sensitivity Troponin:  No results for input(s): TROPONINIHS in the last 720 hours.   Chemistry Recent Labs  Lab 10/31/24 1313  NA 141  K 3.8  CL 103  CO2 25  GLUCOSE 105*  BUN 18  CREATININE 1.38*  CALCIUM 9.3  MG 2.0  GFRNONAA 36*  ANIONGAP 14    Recent  Labs  Lab 10/31/24 1313  PROT 7.4  ALBUMIN 4.5  AST 33  ALT 22  ALKPHOS 68  BILITOT 0.6   Lipids  Recent Labs  Lab 11/01/24 0447  CHOL 156  TRIG 99  HDL 43  LDLCALC 94  CHOLHDL 3.7    Hematology Recent Labs  Lab 10/31/24 1313 11/01/24 0447  WBC 7.0 7.9  RBC 4.11 4.12  HGB 13.6 13.7  HCT 39.0 39.2  MCV 94.9 95.1  MCH 33.1 33.3  MCHC 34.9 34.9  RDW 12.7 12.6  PLT 220 221   Thyroid   Recent Labs  Lab 10/31/24 1313  TSH 3.430    BNPNo results for input(s): BNP, PROBNP in the last 168 hours.  DDimer  Recent Labs  Lab 10/31/24 2337  DDIMER 0.52*    Radiology/Studies:  CT ANGIO HEAD NECK W WO CM Result Date: 10/31/2024 EXAM: CTA Head and Neck with Intravenous Contrast. CT Head without Contrast. CLINICAL HISTORY: Neuro deficit, acute, stroke suspected. TECHNIQUE: Axial CTA images of the head and neck performed with intravenous contrast. MIP reconstructed images were created and reviewed. Axial computed tomography images of the head/brain performed without intravenous contrast. Note: Per PQRS, the description of internal carotid artery percent stenosis, including 0 percent or normal exam, is based on [employer] American Symptomatic Carotid Endarterectomy Trial (NASCET) criteria. Dose reduction technique was used including one or more of the following: automated exposure control, adjustment of mA and kV according to patient size, and/or iterative reconstruction. CONTRAST: 60mL iohexol (OMNIPAQUE) 350 MG/ML injection. COMPARISON: None provided. FINDINGS: BRAIN: No acute intraparenchymal hemorrhage. No mass lesion. No acute infarct better characterized on same day MRI of the head. No midline shift or extra-axial collection. VENTRICLES: No hydrocephalus. ORBITS: The orbits are unremarkable. SINUSES AND MASTOIDS: The paranasal sinuses and mastoid air cells are clear. COMMON CAROTID ARTERIES: No significant stenosis. No  dissection or occlusion. INTERNAL CAROTID ARTERIES: No stenosis  by NASCET criteria. No dissection or occlusion. VERTEBRAL ARTERIES: No significant stenosis. No dissection or occlusion. ANTERIOR CEREBRAL ARTERIES: No significant stenosis. No occlusion. No aneurysm. MIDDLE CEREBRAL ARTERIES: No significant stenosis. No occlusion. No aneurysm. POSTERIOR CEREBRAL ARTERIES: No significant stenosis. No occlusion. No aneurysm. BASILAR ARTERY: No significant stenosis. No occlusion. No aneurysm. SOFT TISSUES: No acute finding. No masses or lymphadenopathy. BONES: No acute osseous abnormality. IMPRESSION: 1. No large vessel occlusion or significant stenosis. Electronically signed by: Gilmore Molt MD 10/31/2024 03:24 PM EST RP Workstation: [id number]   MR BRAIN WO CONTRAST Result Date: 10/31/2024 EXAM: MRI BRAIN WITHOUT CONTRAST 10/31/2024 02:30:59 PM TECHNIQUE: Multiplanar multisequence MRI of the head/brain was performed without the administration of intravenous contrast. COMPARISON: None available. CLINICAL HISTORY: Neuro deficit, acute, stroke suspected Neuro deficit, acute, stroke suspected FINDINGS: BRAIN AND VENTRICLES: Acute right frontal cortical infarct. No intracranial hemorrhage. No mass. No midline shift. No hydrocephalus. Mild for age scattered T2/FLAIR hyperintensities in the white matter, compatible with chronic microvascular ischemic change. Normal flow voids. ORBITS: No acute abnormality. SINUSES AND MASTOIDS: No acute abnormality. BONES AND SOFT TISSUES: Normal marrow signal. No acute soft tissue abnormality. IMPRESSION: 1. Acute right frontal cortical infarct. Electronically signed by: Gilmore Molt MD 10/31/2024 03:06 PM EST RP Workstation: [id number]     Assessment and Plan:  1. Paroxysmal Atrial Fibrillation - This is a new diagnosis for the patient but she reports having intermittent palpitations prior to admission which were typically most notable at night. She initially had atrial fibrillation yesterday evening and converted back to normal sinus  rhythm with IV Amiodarone but had recurrence this morning which lasted for approximately 3 hours and then back in normal sinus rhythm. Would continue with IV Amiodarone loading today. She does have hypothyroidism so if staying on Amiodarone long-term, TSH would need to be followed closely. Agree with starting Lopressor 25 mg twice daily which can be titrated further pending HR/BP response. Echo performed this morning with official report pending.  - Her CHA2DS2-VASc score is at least 7 and she is currently on IV Heparin for anticoagulation. Would switch to Eliquis once confirmed with Neurology.  2. Acute CVA - MRI on admission showed an acute right frontal cortical infarct. Suspect secondary to newly diagnosed atrial fibrillation. Neurology consult pending.  3. HTN - BP elevated at 171/66 this AM. On Losartan-HCTZ 50-12.5 mg daily at home and suspect this has been held to allow for permissive hypertension. Also listed as taking Amlodipine but this is a historical medication and was not taking at the time of admission. Agree with starting Lopressor as outlined above and can further titrate pending HR/BP response.   4. HLD - FLP shows total cholesterol 156, triglycerides 99, HDL 43 and LDL 94. She has been switched to Crestor 10mg  daily by the Hospitalist team.   5. Type 2 DM - Hgb A1c at 9.6 this admission. Management per the admitting team.   Risk Assessment/Risk Scores:   CHA2DS2-VASc Score = 7  This indicates a 11.2% annual risk of stroke. The patient's score is based upon: CHF History: 0 HTN History: 1 Diabetes History: 1 Stroke History: 2 Vascular Disease History: 0 Age Score: 2 Gender Score: 1   Signed, Laymon CHRISTELLA Qua, PA-C  11/01/2024 11:27 AM   Attending Note     Patient seen and discussed with PA Qua, I agree with her documentation. 89 yo female history of HTN, DM2, HLD, hypothryoidism, GERD presentd with right  lower facial numbness/weakness and abnormal speech.  Stroke evaluation by primary. On admission noted to be in afib, a new diagnosis for the patient. Cardiology consulted to help manage         WBC 7 Hgb 13.6 Plt 220 K 3.8 Cr 1.38 BUN 18 GFR 36 A1c 9.6 Mg 2 TSH 3.4 Ddimer 0.52  EKG SR, PACs Echo pending MRI brain: Acute right frontal cortical infarct.  CTA head and neck: No large vessel occlusion or significant stenosis.      1.Afib - new diagnosis this admission. Has been paroxysmal. In setting of CVA this admission possibly afib related - started on IV amiodarone by overnight team to allow for ongoing permissive hypertension given CVA - can continue amiodarone for time being, likely overall short course would be planned. Continue IV until tomorrow, likely transition to oral tomorrow for a few weeks then discontinue as outpatient. - can continue lopressor 25mg  bid   - hep gtt if ok with neurology, also transition to DOAC later in admission if ok with neurology -f/u echo   2.CVA - per primary team and neuro  Dorn Ross MD

## 2024-11-01 NOTE — Progress Notes (Signed)
 PHARMACY - ANTICOAGULATION CONSULT NOTE  Pharmacy Consult for heparin Indication: atrial fibrillation in the setting of acute stroke  No Known Allergies  Patient Measurements: Height: 5' 5 (165.1 cm) Weight: 78.5 kg (173 lb 1 oz) IBW/kg (Calculated) : 57 HEPARIN DW (KG): 73.4  Vital Signs: Temp: 97.9 F (36.6 C) (11/06 1122) Temp Source: Oral (11/06 1122) BP: 165/78 (11/06 1630) Pulse Rate: 62 (11/06 1630)  Labs: Recent Labs    10/31/24 1313 11/01/24 0447 11/01/24 0849 11/01/24 1854  HGB 13.6 13.7  --   --   HCT 39.0 39.2  --   --   PLT 220 221  --   --   APTT 24  --   --   --   LABPROT 12.3  --   --   --   INR 0.9  --   --   --   HEPARINUNFRC  --   --  >1.10* 0.81*  CREATININE 1.38*  --   --   --     Estimated Creatinine Clearance: 27.5 mL/min (A) (by C-G formula based on SCr of 1.38 mg/dL (H)).   Medical History: Past Medical History:  Diagnosis Date   Diabetes mellitus without complication (HCC)    Essential hypertension, benign    Hypercholesteremia    Hyperlipidemia    Impaired glucose tolerance    Thyroid  disease     Medications:  Medications Prior to Admission  Medication Sig Dispense Refill Last Dose/Taking   amLODipine (NORVASC) 2.5 MG tablet Take 2.5 mg by mouth at bedtime.   10/30/2024 Bedtime   atorvastatin (LIPITOR) 80 MG tablet Take 80 mg by mouth at bedtime.   10/30/2024 Bedtime   cholecalciferol (VITAMIN D3) 25 MCG (1000 UNIT) tablet Take 1,000 Units by mouth at bedtime.   10/30/2024 Bedtime   Coenzyme Q10 50 MG CAPS Take 1 capsule by mouth at bedtime.   10/30/2024 Bedtime   diphenhydrAMINE (BENADRYL) 25 MG tablet Take 25 mg by mouth at bedtime.    10/30/2024 Bedtime   glipiZIDE (GLUCOTROL XL) 5 MG 24 hr tablet Take 5 mg by mouth 2 (two) times daily.   10/31/2024 Morning   levothyroxine (SYNTHROID) 25 MCG tablet Take 25 mcg by mouth daily.   10/31/2024 Morning   losartan-hydrochlorothiazide (HYZAAR) 50-12.5 MG per tablet Take 1 tablet by mouth at  bedtime.   10/30/2024 Bedtime   Omega-3 Fatty Acids (FISH OIL PO) Take 1 capsule by mouth at bedtime.   10/30/2024 Bedtime   pantoprazole  (PROTONIX ) 20 MG tablet Take 1 tablet (20 mg total) by mouth daily. 30 tablet 0 10/31/2024 Morning   TURMERIC PO Take 1 tablet by mouth at bedtime.   10/30/2024 Bedtime   vitamin B-12 (CYANOCOBALAMIN ) 100 MCG tablet Take 100 mcg by mouth at bedtime.   10/30/2024 Bedtime   Scheduled:   Chlorhexidine Gluconate Cloth  6 each Topical Q0600   insulin aspart  0-6 Units Subcutaneous TID WC   metoprolol tartrate  25 mg Oral BID   mouth rinse  15 mL Mouth Rinse 4 times per day   [START ON 11/02/2024] rosuvastatin  20 mg Oral Daily    Assessment: 89yo female presented to ED POV with facial droop, MRI reveals acute right frontal cortical infarct, found to be in Afib >> to begin heparin.  Date Time Results Comments 11/6 0849 HL >1.10 no issues noted per RN 11/6 1854 HL 0.81 Supratherapeutic; rate 600 units/hr  Goal of Therapy:  Heparin level 0.3-0.5 units/ml Monitor platelets by anticoagulation protocol:  Yes   Plan:  Decrease heparin infusion to 450 units/hr Repeat HL 8 hours after rate change Monitor heparin levels and CBC.  Suhana Wilner Rodriguez-Guzman PharmD, BCPS 11/01/2024 7:40 PM

## 2024-11-01 NOTE — Progress Notes (Signed)
  Echocardiogram 2D Echocardiogram has been performed.  Melissa Kane 11/01/2024, 11:06 AM

## 2024-11-01 NOTE — Progress Notes (Signed)
 Pt transferred to higher level of care due to afib with rvr. She was AOx4 and in no acute distress during transfer.

## 2024-11-01 NOTE — Inpatient Diabetes Management (Addendum)
 Inpatient Diabetes Program Recommendations  AACE/ADA: New Consensus Statement on Inpatient Glycemic Control   Target Ranges:  Prepandial:   less than 140 mg/dL      Peak postprandial:   less than 180 mg/dL (1-2 hours)      Critically ill patients:  140 - 180 mg/dL    Latest Reference Range & Units 10/31/24 13:05 10/31/24 20:40 11/01/24 07:11  Glucose-Capillary 70 - 99 mg/dL 96 889 (H) 864 (H)    Latest Reference Range & Units 10/31/24 13:13  Hemoglobin A1C 4.8 - 5.6 % 9.6 (H)   Review of Glycemic Control  Diabetes history: DM2 Outpatient Diabetes medications: Glipizide XL 5 mg BID Current orders for Inpatient glycemic control: Novolog 0-6 units TID with meals  Inpatient Diabetes Program Recommendations:    Outpatient DM: Patient's insurance covers Lantus SoloStar insulin pens and FreeStyle Libre 3 Plus sensors requires Prior Authorization (outpatient [facility] pharmacy has started PA).  At time of discharge please provide Rx for Lantus SoloStar pens [phone]), insulin pen needles [id number]), and FreeStyle Libre 3 Plus sensors [phone]).   NOTE: Patient admitted with CVA. Per note by Dr. Bryn today, patient has taken basal insulin in the past (stopped about 1 year ago) and recently started on Glipizide. Given A1C of 9.6%, MD notes patient may need to be restarted on low dose basal insulin. Will plan to see patient today.  Addendum 11/01/24@14 :15-Spoke with patient, her 2 sons and 2 daughter in laws at bedside regarding DM, insulin, and using CGM sensors.  Patient reports that she use to take Victoza which was stopped about 1 year ago due to improved DM control and she was started on Glipizide about 2 1/2 weeks ago since DM was uncontrolled. Patient reports she has been taking Glipizide as prescribed and that she has been checking glucose BID and it has ranged from 90's to 200's mg/dl. Patient states that she has been keeping a log of glucose trends that she is suppose to take to follow up visit  with PCP on Monday 11/05/24. Discussed A1C of 9.6% on 10/31/24 indicating an average glucose of 229 mg/dl. Discussed glucose goals and explained that given her age, her glucose goals be be a little higher. Patient states that she does not think she has ever had a low glucose. Discussed hypoglycemia, symptoms, and treatment. Reviewed hyperglycemia, symptoms, and treatment. Patient is interested in using a CGM sensor. Her insurance covers the [employer] 3 Plus but requires a prior authorization (have asked outpatient [facility] pharmacy to start the PA process). Educated patient and family on FreeStyle Libre3 Plus CGM regarding application and changing CGM sensor (alternate every 14 days on back of arms), 1 hour warm-up, how to scan sensor to start a new sensor, and how to use app to check glucose.   Patient has been given Freestyle Libre3 sensor samples (3).  Provided educational packet regarding FreeStyle Libre3 CGM.  Patient's daughter in law applied sensor to back of patient's upper left arm.  Patient plans to use iphone FreeStyle Libre3 app to read FreeStyle Libre sensor and was able to download the app and create an account. Informed patient that it would be requested that attending provider provide Rx for first month of FreeStyle Libre3 sensors and that she have PCP continue to provide Rx for FreeStyle Libre3 sensors going forward. Asked patient to be sure to let PCP know about Liane and allow provider to review reports from Libre3 app so the provider can use the information to continue to make  adjustments with DM medications if needed. Discussed Lantus insulin and explained that provider is likely going to discharge her on basal insulin and Lantus is covered by insurance. Discussed insulin storage, insulin injection sites, importance of rotating injection sites, and insulin pen needle disposal. Educated patient and family on insulin pen use at home.  Reviewed all steps of insulin pen including attachment of  needle, 2-unit air shot, dialing up dose, giving injection, removing needle, disposal of sharps, storage of unused insulin, disposal of insulin etc. Patient able to provide successful return demonstration. Patient states she feels she will be fine with using insulin as she is familiar with giving injections when on the Victoza. Asked patient to be sure to get some glucose tablets or other quick glucose source to have at bedside and in purse to use for hypoglycemia treatment if needed. Encouraged patient and family to call PCP if patient has any issues at all with hypoglycemia as insulin may need to be decreased.  Patient verbalized understanding and has no questions at this time.  Thanks, Earnie Gainer, RN, MSN, CDCES Diabetes Coordinator Inpatient Diabetes Program [phone] (Team Pager from 8am to 5pm)

## 2024-11-02 ENCOUNTER — Other Ambulatory Visit (HOSPITAL_COMMUNITY): Payer: Self-pay

## 2024-11-02 ENCOUNTER — Telehealth (HOSPITAL_COMMUNITY): Payer: Self-pay

## 2024-11-02 ENCOUNTER — Telehealth (HOSPITAL_COMMUNITY): Payer: Self-pay | Admitting: Pharmacy Technician

## 2024-11-02 DIAGNOSIS — I639 Cerebral infarction, unspecified: Secondary | ICD-10-CM | POA: Diagnosis not present

## 2024-11-02 DIAGNOSIS — I480 Paroxysmal atrial fibrillation: Secondary | ICD-10-CM | POA: Diagnosis not present

## 2024-11-02 LAB — CBC
HCT: 38.6 % (ref 36.0–46.0)
Hemoglobin: 13.4 g/dL (ref 12.0–15.0)
MCH: 33.3 pg (ref 26.0–34.0)
MCHC: 34.7 g/dL (ref 30.0–36.0)
MCV: 96 fL (ref 80.0–100.0)
Platelets: 215 K/uL (ref 150–400)
RBC: 4.02 MIL/uL (ref 3.87–5.11)
RDW: 12.7 % (ref 11.5–15.5)
WBC: 9.1 K/uL (ref 4.0–10.5)
nRBC: 0 % (ref 0.0–0.2)

## 2024-11-02 LAB — BASIC METABOLIC PANEL WITH GFR
Anion gap: 11 (ref 5–15)
BUN: 18 mg/dL (ref 8–23)
CO2: 26 mmol/L (ref 22–32)
Calcium: 9.1 mg/dL (ref 8.9–10.3)
Chloride: 102 mmol/L (ref 98–111)
Creatinine, Ser: 1.36 mg/dL — ABNORMAL HIGH (ref 0.44–1.00)
GFR, Estimated: 37 mL/min — ABNORMAL LOW (ref >60)
Glucose, Bld: 173 mg/dL — ABNORMAL HIGH (ref 70–99)
Potassium: 3.5 mmol/L (ref 3.5–5.1)
Sodium: 139 mmol/L (ref 135–145)

## 2024-11-02 LAB — GLUCOSE, CAPILLARY: Glucose-Capillary: 126 mg/dL — ABNORMAL HIGH (ref 70–99)

## 2024-11-02 LAB — HEPARIN LEVEL (UNFRACTIONATED): Heparin Unfractionated: 0.55 [IU]/mL (ref 0.30–0.70)

## 2024-11-02 LAB — APTT: aPTT: 56 s — ABNORMAL HIGH (ref 24–36)

## 2024-11-02 LAB — MAGNESIUM: Magnesium: 2.2 mg/dL (ref 1.7–2.4)

## 2024-11-02 MED ORDER — APIXABAN 5 MG PO TABS: 5.0000 mg | ORAL_TABLET | Freq: Two times a day (BID) | ORAL | 0 refills | Status: DC

## 2024-11-02 MED ORDER — AMIODARONE HCL 200 MG PO TABS: 200.0000 mg | ORAL_TABLET | Freq: Two times a day (BID) | ORAL | 0 refills | Status: DC

## 2024-11-02 MED ORDER — AMIODARONE HCL 200 MG PO TABS
200.0000 mg | ORAL_TABLET | Freq: Two times a day (BID) | ORAL | Status: DC
  Administered 2024-11-02: 200 mg via ORAL
  Filled 2024-11-02: qty 1

## 2024-11-02 MED ORDER — ROSUVASTATIN CALCIUM 10 MG PO TABS: 10.0000 mg | ORAL_TABLET | Freq: Every day | ORAL | 0 refills | Status: DC

## 2024-11-02 MED ORDER — APIXABAN 5 MG PO TABS
5.0000 mg | ORAL_TABLET | Freq: Two times a day (BID) | ORAL | Status: DC
  Administered 2024-11-02: 5 mg via ORAL
  Filled 2024-11-02: qty 1

## 2024-11-02 MED ORDER — MAGNESIUM OXIDE -MG SUPPLEMENT 400 (240 MG) MG PO TABS
400.0000 mg | ORAL_TABLET | Freq: Once | ORAL | Status: AC
  Administered 2024-11-02: 400 mg via ORAL
  Filled 2024-11-02: qty 1

## 2024-11-02 NOTE — Progress Notes (Signed)
 PHARMACY - ANTICOAGULATION CONSULT NOTE  Pharmacy Consult for heparin >> eliquis Indication: atrial fibrillation in the setting of acute stroke  No Known Allergies  Patient Measurements: Height: 5' 5 (165.1 cm) Weight: 78.5 kg (173 lb 1 oz) IBW/kg (Calculated) : 57 HEPARIN DW (KG): 73.4  Vital Signs: Temp: 97.7 F (36.5 C) (11/07 0700) Temp Source: Oral (11/07 0700) BP: 168/60 (11/07 0600) Pulse Rate: 47 (11/07 0600)  Labs: Recent Labs    10/31/24 1313 11/01/24 0447 11/01/24 0849 11/01/24 1854 11/02/24 0457 11/02/24 0841  HGB 13.6 13.7  --   --  13.4  --   HCT 39.0 39.2  --   --  38.6  --   PLT 220 221  --   --  215  --   APTT 24  --   --   --  56*  --   LABPROT 12.3  --   --   --   --   --   INR 0.9  --   --   --   --   --   HEPARINUNFRC  --   --  >1.10* 0.81* 0.55  --   CREATININE 1.38*  --   --   --   --  1.36*    Estimated Creatinine Clearance: 27.9 mL/min (A) (by C-G formula based on SCr of 1.36 mg/dL (H)).   Medical History: Past Medical History:  Diagnosis Date   Diabetes mellitus without complication (HCC)    Essential hypertension, benign    Hypercholesteremia    Hyperlipidemia    Impaired glucose tolerance    Thyroid  disease     Medications:  Medications Prior to Admission  Medication Sig Dispense Refill Last Dose/Taking   amLODipine (NORVASC) 2.5 MG tablet Take 2.5 mg by mouth at bedtime.   10/30/2024 Bedtime   atorvastatin (LIPITOR) 80 MG tablet Take 80 mg by mouth at bedtime.   10/30/2024 Bedtime   cholecalciferol (VITAMIN D3) 25 MCG (1000 UNIT) tablet Take 1,000 Units by mouth at bedtime.   10/30/2024 Bedtime   Coenzyme Q10 50 MG CAPS Take 1 capsule by mouth at bedtime.   10/30/2024 Bedtime   diphenhydrAMINE (BENADRYL) 25 MG tablet Take 25 mg by mouth at bedtime.    10/30/2024 Bedtime   glipiZIDE (GLUCOTROL XL) 5 MG 24 hr tablet Take 5 mg by mouth 2 (two) times daily.   10/31/2024 Morning   levothyroxine (SYNTHROID) 25 MCG tablet Take 25 mcg by  mouth daily.   10/31/2024 Morning   losartan-hydrochlorothiazide (HYZAAR) 50-12.5 MG per tablet Take 1 tablet by mouth at bedtime.   10/30/2024 Bedtime   Omega-3 Fatty Acids (FISH OIL PO) Take 1 capsule by mouth at bedtime.   10/30/2024 Bedtime   pantoprazole  (PROTONIX ) 20 MG tablet Take 1 tablet (20 mg total) by mouth daily. 30 tablet 0 10/31/2024 Morning   TURMERIC PO Take 1 tablet by mouth at bedtime.   10/30/2024 Bedtime   vitamin B-12 (CYANOCOBALAMIN ) 100 MCG tablet Take 100 mcg by mouth at bedtime.   10/30/2024 Bedtime   Scheduled:   amiodarone  200 mg Oral BID   apixaban  5 mg Oral BID   Chlorhexidine Gluconate Cloth  6 each Topical Q0600   insulin aspart  0-6 Units Subcutaneous TID WC   mouth rinse  15 mL Mouth Rinse 4 times per day   rosuvastatin  20 mg Oral Daily    Assessment: 89yo female presented to ED POV with facial droop, MRI reveals acute right frontal cortical  infarct, found to be in Afib >> to transition from heparin to Eliquis    Goal of Therapy:  Heparin level 0.3-0.5 units/ml Monitor platelets by anticoagulation protocol: Yes   Plan:  Stop heparin Start apixaban 5 mg twice daily Monitor renal function for further dose adjustments.   Elspeth Sour, PharmD Clinical Pharmacist 11/02/2024 11:01 AM

## 2024-11-02 NOTE — Progress Notes (Signed)
 PHARMACY - ANTICOAGULATION CONSULT NOTE  Pharmacy Consult for heparin Indication: Afib in the setting of stroke  Labs: Recent Labs    10/31/24 1313 11/01/24 0447 11/01/24 0849 11/01/24 1854 11/02/24 0457  HGB 13.6 13.7  --   --  13.4  HCT 39.0 39.2  --   --  38.6  PLT 220 221  --   --  215  APTT 24  --   --   --  56*  LABPROT 12.3  --   --   --   --   INR 0.9  --   --   --   --   HEPARINUNFRC  --   --  >1.10* 0.81* 0.55  CREATININE 1.38*  --   --   --   --    Assessment/Plan:  89yo female therapeutic on heparin after rate change. Will continue infusion at current rate of 450 units/hr and f/u transition to DOAC.  Marvetta Dauphin, PharmD, BCPS 11/02/2024 6:27 AM

## 2024-11-02 NOTE — Discharge Summary (Signed)
 Physician Discharge Summary   Patient: Melissa Kane MRN: [id number] DOB: 1931-05-04  Admit date:     10/31/2024  Discharge date: 11/02/24  Discharge Physician: Bernardino KATHEE Come   PCP: Rosamond Leta KATHEE, MD   Recommendations at discharge:  Requires close PCP follow up with attention to blood sugar. Note discrepancy between the patient's HbA1c of 9.6% and her inpatient glycemic trends which required no insulin or other intervention to remain at goal. Diabetes education was given extensively as at least basal insulin was anticipated to be needed, and freestyle libre sensors were given as samples. she will monitor glucose and bring records to PCP. Glipizide (PTA medication) was the only diabetic medication continued at discharge.  Follow up with neurology for stroke (referral placed) Follow up with cardiology for atrial fibrillation (new diagnosis this admission), continuing amiodarone until follow up, started DOAC as well.  Will need TSH monitored, especially if amiodarone is continued  Discharge Diagnoses: Principal Problem:   Acute CVA (cerebrovascular accident) [facility])  Hospital Course: Melissa Kane is a 89 y.o. female with a history of HTN, T2DM, hypothyroidism, HLD, GERD who presented to the ED with abnormal speech, right lower facial numbness and weakness. MRI revealed a small cortical frontal infarct for which she was admitted. Developed AFib (new diagnosis) with RVR overnight, so transferred to SDU and started on low dose heparin, amiodarone. Neurology and cardiology consultations were performed and she remained stable.  Assessment and Plan: Acute right frontal cortical infarct: Now suspicion for embolic phenomenon given new AFib and previous palpitations.  - Changed DAPT to heparin then to DOAC with stable neuro exam.   - Continue statin, LDL is not at goal, changed atorvastatin > rosuvastatin dosed for CrCl < 31ml/min.     New diagnosis PAF with RVR:  - Was initiated on amiodarone, also  started metoprolol but having sinus bradycardia, so this will be stopped. Will continue amio until cardiology follow up.  - Echocardiogram showed preserved LVSF and normal atria sizes.    HTN:  - Continue PTA medications. Pt has not been taking hyzaar, so PCP follow up recommended, this was not prescribed.    HLD:  - Change atorva > rosuva and recheck in outpatient setting.    T2DM: Uncontrolled with hyperglycemia, HbA1c 9.6%. Was recently started on [facility], thought she'd been on insulin, but this was actually victoza. Was thought to have improved control with weight loss so no longer on that.  - Unable to give metformin with CrCl, glipizide has been tolerated without hypoglycemia, so this is continued. Since no insulin has been required during inpatient stay, none is prescribed at discharge. Will need close PCP follow up with records from freestyle [place] sensor.   Hypothyroidism:  - Continue synthroid   Stage IIIb CKD:  - Avoid nephrotoxins.   Consultants: Neurology, cardiology Procedures performed: Echo  Disposition: Home Diet recommendation: Carb-modified, heart healthy DISCHARGE MEDICATION: Allergies as of 11/02/2024   No Known Allergies      Medication List     STOP taking these medications    atorvastatin 80 MG tablet Commonly known as: LIPITOR   TURMERIC PO       TAKE these medications    amiodarone 200 MG tablet Commonly known as: PACERONE Take 1 tablet (200 mg total) by mouth 2 (two) times daily.   amLODipine 2.5 MG tablet Commonly known as: NORVASC Take 2.5 mg by mouth at bedtime.   apixaban 5 MG Tabs tablet Commonly known as: ELIQUIS Take 1 tablet (  5 mg total) by mouth 2 (two) times daily.   Benadryl 25 MG tablet Generic drug: diphenhydrAMINE Take 25 mg by mouth at bedtime.   cholecalciferol 25 MCG (1000 UNIT) tablet Commonly known as: VITAMIN D3 Take 1,000 Units by mouth at bedtime.   Coenzyme Q10 50 MG Caps Take 1 capsule by mouth at bedtime.    FISH OIL PO Take 1 capsule by mouth at bedtime.   glipiZIDE 5 MG 24 hr tablet Commonly known as: GLUCOTROL XL Take 5 mg by mouth 2 (two) times daily.   levothyroxine 25 MCG tablet Commonly known as: SYNTHROID Take 25 mcg by mouth daily.   losartan-hydrochlorothiazide 50-12.5 MG tablet Commonly known as: HYZAAR Take 1 tablet by mouth at bedtime.   pantoprazole  20 MG tablet Commonly known as: PROTONIX  Take 1 tablet (20 mg total) by mouth daily.   rosuvastatin 10 MG tablet Commonly known as: CRESTOR Take 1 tablet (10 mg total) by mouth daily. Start taking on: November 03, 2024   vitamin B-12 100 MCG tablet Commonly known as: CYANOCOBALAMIN  Take 100 mcg by mouth at bedtime.        Follow-up Information     Johnson Laymon HERO, PA-C Follow up on 11/29/2024.   Specialties: Cardiology, Cardiology Why: Cardiology Hospital Follow-up on 11/29/2024 at 1:30 PM. Contact information: [address] [place] [address] [phone]         Rosamond Leta NOVAK, MD Follow up.   Specialty: Internal Medicine Contact information: [address] [place] [address] [phone]         Rosemarie Eather RAMAN, MD. Schedule an appointment as soon as possible for a visit.   Specialties: Neurology, Radiology Contact information: [address] [place] [address] [phone]                Discharge Exam: Melissa Kane   10/31/24 1315  Weight: 78.5 kg  Elderly female in no distress Clear, nonlabored RRR, no MRG or edema Alert, oriented, no new focal deficits.  Condition at discharge: stable  The results of significant diagnostics from this hospitalization (including imaging, microbiology, ancillary and laboratory) are listed below for reference.   Imaging Studies: ECHOCARDIOGRAM COMPLETE Result Date: 11/01/2024    ECHOCARDIOGRAM REPORT   Patient Name:   Melissa Kane Date of Exam: 11/01/2024 Medical Rec #:  [id number]     Height:       65.0 in Accession #:     [id number]    Weight:       173.1 lb Date of Birth:  22-Mar-1931     BSA:          1.860 m Patient Age:    89 years      BP:           153/87 mmHg Patient Gender: F             HR:           64 bpm. Exam Location:  [facility] Procedure: 2D Echo, Cardiac Doppler, Color Doppler and Intracardiac            Opacification Agent (Both Spectral and Color Flow Doppler were            utilized during procedure). Indications:    Stroke. New AFIB.  History:        Patient has no prior history of Echocardiogram examinations.                 Abnormal ECG,  Stroke, Arrythmias:Atrial Fibrillation,                 Signs/Symptoms:Shortness of Breath and Dyspnea; Risk                 Factors:Hypertension, Dyslipidemia and Diabetes.  Sonographer:    Ellouise Mose RDCS Referring Phys: [phone] BERNARDINO KATHEE COME IMPRESSIONS  1. Left ventricular ejection fraction, by estimation, is 50 to 55%. The left ventricle has low normal function. The left ventricle has no regional wall motion abnormalities. There is moderate left ventricular hypertrophy. Left ventricular diastolic parameters are consistent with Grade I diastolic dysfunction (impaired relaxation). Elevated left atrial pressure.  2. Right ventricular systolic function is normal. The right ventricular size is normal. Tricuspid regurgitation signal is inadequate for assessing PA pressure.  3. The mitral valve is degenerative. Trivial mitral valve regurgitation. No evidence of mitral stenosis. Severe mitral annular calcification.  4. The aortic valve is tricuspid. There is mild calcification of the aortic valve. There is mild thickening of the aortic valve. Aortic valve regurgitation is not visualized. Aortic valve sclerosis is present, with no evidence of aortic valve stenosis.  5. The inferior vena cava is normal in size with greater than 50% respiratory variability, suggesting right atrial pressure of 3 mmHg. FINDINGS  Left Ventricle: Papillary muscle calcification. Left ventricular ejection  fraction, by estimation, is 50 to 55%. The left ventricle has low normal function. The left ventricle has no regional wall motion abnormalities. Definity contrast agent was given IV to delineate the left ventricular endocardial borders. The left ventricular internal cavity size was normal in size. There is moderate left ventricular hypertrophy. Left ventricular diastolic parameters are consistent with Grade I diastolic dysfunction (impaired relaxation). Elevated left atrial pressure. Right Ventricle: The right ventricular size is normal. Right vetricular wall thickness was not well visualized. Right ventricular systolic function is normal. Tricuspid regurgitation signal is inadequate for assessing PA pressure. Left Atrium: Left atrial size was normal in size. Right Atrium: Right atrial size was normal in size. Pericardium: There is no evidence of pericardial effusion. Presence of epicardial fat layer. Mitral Valve: The mitral valve is degenerative in appearance. There is moderate thickening of the posterior mitral valve leaflet(s). There is mild calcification of the posterior mitral valve leaflet(s). Severe mitral annular calcification. Trivial mitral  valve regurgitation. No evidence of mitral valve stenosis. MV peak gradient, 8.2 mmHg. The mean mitral valve gradient is 2.0 mmHg. Tricuspid Valve: The tricuspid valve is normal in structure. Tricuspid valve regurgitation is trivial. No evidence of tricuspid stenosis. Aortic Valve: The aortic valve is tricuspid. There is mild calcification of the aortic valve. There is mild thickening of the aortic valve. There is mild aortic valve annular calcification. Aortic valve regurgitation is not visualized. Aortic valve sclerosis is present, with no evidence of aortic valve stenosis. Aortic valve mean gradient measures 3.9 mmHg. Aortic valve peak gradient measures 7.6 mmHg. Aortic valve area, by VTI measures 2.90 cm. Pulmonic Valve: The pulmonic valve was not well  visualized. Pulmonic valve regurgitation is mild. No evidence of pulmonic stenosis. Aorta: The aortic root and ascending aorta are structurally normal, with no evidence of dilitation. Venous: The inferior vena cava is normal in size with greater than 50% respiratory variability, suggesting right atrial pressure of 3 mmHg. IAS/Shunts: No atrial level shunt detected by color flow Doppler.  LEFT VENTRICLE PLAX 2D LVIDd:         3.70 cm     Diastology LVIDs:  2.90 cm     LV e' medial:    7.05 cm/s LV PW:         1.30 cm     LV E/e' medial:  12.7 LV IVS:        1.50 cm     LV e' lateral:   5.03 cm/s LVOT diam:     2.10 cm     LV E/e' lateral: 17.9 LV SV:         91 LV SV Index:   49 LVOT Area:     3.46 cm  LV Volumes (MOD) LV vol d, MOD A2C: 50.5 ml LV vol d, MOD A4C: 55.0 ml LV vol s, MOD A2C: 14.2 ml LV vol s, MOD A4C: 24.2 ml LV SV MOD A2C:     36.3 ml LV SV MOD A4C:     55.0 ml LV SV MOD BP:      33.2 ml RIGHT VENTRICLE            IVC RV S prime:     6.96 cm/s  IVC diam: 1.60 cm TAPSE (M-mode): 1.3 cm                            PULMONARY VEINS                            Diastolic Velocity: 35.50 cm/s                            S/D Velocity:       0.80                            Systolic Velocity:  29.60 cm/s LEFT ATRIUM             Index        RIGHT ATRIUM           Index LA diam:        3.20 cm 1.72 cm/m   RA Area:     12.10 cm LA Vol (A2C):   23.2 ml 12.47 ml/m  RA Volume:   27.30 ml  14.68 ml/m LA Vol (A4C):   46.2 ml 24.84 ml/m LA Biplane Vol: 36.4 ml 19.57 ml/m  AORTIC VALVE AV Area (Vmax):    2.70 cm AV Area (Vmean):   2.63 cm AV Area (VTI):     2.90 cm AV Vmax:           137.51 cm/s AV Vmean:          95.528 cm/s AV VTI:            0.313 m AV Peak Grad:      7.6 mmHg AV Mean Grad:      3.9 mmHg LVOT Vmax:         107.00 cm/s LVOT Vmean:        72.600 cm/s LVOT VTI:          0.262 m LVOT/AV VTI ratio: 0.84  AORTA Ao Root diam: 3.40 cm Ao Asc diam:  3.20 cm MITRAL VALVE MV Area (PHT): 2.87 cm      SHUNTS MV Area VTI:   2.17 cm     Systemic VTI:  0.26 m MV Peak grad:  8.2 mmHg     Systemic Diam: 2.10 cm MV  Mean grad:  2.0 mmHg MV Vmax:       1.43 m/s MV Vmean:      61.4 cm/s MV Decel Time: 264 msec MV E velocity: 89.80 cm/s MV A velocity: 127.00 cm/s MV E/A ratio:  0.71 Dorn Ross MD Electronically signed by Dorn Ross MD Signature Date/Time: 11/01/2024/1:36:07 PM    Final    CT ANGIO HEAD NECK W WO CM Result Date: 10/31/2024 EXAM: CTA Head and Neck with Intravenous Contrast. CT Head without Contrast. CLINICAL HISTORY: Neuro deficit, acute, stroke suspected. TECHNIQUE: Axial CTA images of the head and neck performed with intravenous contrast. MIP reconstructed images were created and reviewed. Axial computed tomography images of the head/brain performed without intravenous contrast. Note: Per PQRS, the description of internal carotid artery percent stenosis, including 0 percent or normal exam, is based on North American Symptomatic Carotid Endarterectomy Trial (NASCET) criteria. Dose reduction technique was used including one or more of the following: automated exposure control, adjustment of mA and kV according to patient size, and/or iterative reconstruction. CONTRAST: 60mL iohexol (OMNIPAQUE) 350 MG/ML injection. COMPARISON: None provided. FINDINGS: BRAIN: No acute intraparenchymal hemorrhage. No mass lesion. No acute infarct better characterized on same day MRI of the head. No midline shift or extra-axial collection. VENTRICLES: No hydrocephalus. ORBITS: The orbits are unremarkable. SINUSES AND MASTOIDS: The paranasal sinuses and mastoid air cells are clear. COMMON CAROTID ARTERIES: No significant stenosis. No dissection or occlusion. INTERNAL CAROTID ARTERIES: No stenosis by NASCET criteria. No dissection or occlusion. VERTEBRAL ARTERIES: No significant stenosis. No dissection or occlusion. ANTERIOR CEREBRAL ARTERIES: No significant stenosis. No occlusion. No aneurysm. MIDDLE CEREBRAL  ARTERIES: No significant stenosis. No occlusion. No aneurysm. POSTERIOR CEREBRAL ARTERIES: No significant stenosis. No occlusion. No aneurysm. BASILAR ARTERY: No significant stenosis. No occlusion. No aneurysm. SOFT TISSUES: No acute finding. No masses or lymphadenopathy. BONES: No acute osseous abnormality. IMPRESSION: 1. No large vessel occlusion or significant stenosis. Electronically signed by: Gilmore Molt MD 10/31/2024 03:24 PM EST RP Workstation: [id number]   MR BRAIN WO CONTRAST Result Date: 10/31/2024 EXAM: MRI BRAIN WITHOUT CONTRAST 10/31/2024 02:30:59 PM TECHNIQUE: Multiplanar multisequence MRI of the head/brain was performed without the administration of intravenous contrast. COMPARISON: None available. CLINICAL HISTORY: Neuro deficit, acute, stroke suspected Neuro deficit, acute, stroke suspected FINDINGS: BRAIN AND VENTRICLES: Acute right frontal cortical infarct. No intracranial hemorrhage. No mass. No midline shift. No hydrocephalus. Mild for age scattered T2/FLAIR hyperintensities in the white matter, compatible with chronic microvascular ischemic change. Normal flow voids. ORBITS: No acute abnormality. SINUSES AND MASTOIDS: No acute abnormality. BONES AND SOFT TISSUES: Normal marrow signal. No acute soft tissue abnormality. IMPRESSION: 1. Acute right frontal cortical infarct. Electronically signed by: Gilmore Molt MD 10/31/2024 03:06 PM EST RP Workstation: [id number]    Microbiology: Results for orders placed or performed during the hospital encounter of 10/31/24  MRSA Next Gen by PCR, Nasal     Status: None   Collection Time: 11/01/24  1:00 AM   Specimen: Nasal Mucosa; Nasal Swab  Result Value Ref Range Status   MRSA by PCR Next Gen NOT DETECTED NOT DETECTED Final    Comment: (NOTE) The GeneXpert MRSA Assay (FDA approved for NASAL specimens only), is one component of a comprehensive MRSA colonization surveillance program. It is not intended to diagnose MRSA infection nor to  guide or monitor treatment for MRSA infections. Test performance is not FDA approved in patients less than 48 years old. Performed at [facility], [address], [place], [place] [address]  Labs: CBC: Recent Labs  Lab 10/31/24 1313 11/01/24 0447 11/02/24 0457  WBC 7.0 7.9 9.1  NEUTROABS 4.2  --   --   HGB 13.6 13.7 13.4  HCT 39.0 39.2 38.6  MCV 94.9 95.1 96.0  PLT 220 221 215   Basic Metabolic Panel: Recent Labs  Lab 10/31/24 1313 11/02/24 0841  NA 141 139  K 3.8 3.5  CL 103 102  CO2 25 26  GLUCOSE 105* 173*  BUN 18 18  CREATININE 1.38* 1.36*  CALCIUM 9.3 9.1  MG 2.0 2.2   Liver Function Tests: Recent Labs  Lab 10/31/24 1313  AST 33  ALT 22  ALKPHOS 68  BILITOT 0.6  PROT 7.4  ALBUMIN 4.5   CBG: Recent Labs  Lab 11/01/24 0711 11/01/24 1112 11/01/24 1608 11/01/24 2117 11/02/24 0803  GLUCAP 135* 116* 149* 127* 126*    Discharge time spent: greater than 30 minutes.  Signed: Bernardino KATHEE Come, MD [facility] 11/02/2024

## 2024-11-02 NOTE — Telephone Encounter (Signed)
 Patient Product/process Development Scientist completed.    The patient is insured through [facility]. Patient has Medicare and is not eligible for a copay card, but may be able to apply for patient assistance or Medicare RX Payment Plan (Patient Must reach out to their plan, if eligible for payment plan), if available.    Ran test claim for Eliquis 5 mg and the current 30 day co-pay is $247.30 due to a deductible.  Will be $47.00 once deductible is met.   This test claim was processed through [facility]- copay amounts may vary at other pharmacies due to pharmacy/plan contracts, or as the patient moves through the different stages of their insurance plan.     Reyes Sharps, CPHT Pharmacy Technician Patient Advocate Specialist [facility] Patient Advocate Team Direct Number: [phone]  Fax: [phone]

## 2024-11-02 NOTE — Plan of Care (Signed)

## 2024-11-02 NOTE — Care Management Important Message (Signed)
 Important Message  Patient Details  Name: Melissa Kane MRN: [id number] Date of Birth: 1931-11-22   Important Message Given:  Yes - Medicare IM     Maidie Streight L Urban [employer] 11/02/2024, 11:36 AM

## 2024-11-02 NOTE — Progress Notes (Addendum)
 Rounding Note   Patient Name: Melissa Kane Date of Encounter: 11/02/2024  [facility] Cardiologist: New  Subjective No complaints  Scheduled Meds:  Chlorhexidine Gluconate Cloth  6 each Topical Q0600   insulin aspart  0-6 Units Subcutaneous TID WC   magnesium oxide  400 mg Oral Once   metoprolol tartrate  25 mg Oral BID   mouth rinse  15 mL Mouth Rinse 4 times per day   rosuvastatin  20 mg Oral Daily   Continuous Infusions:  amiodarone Stopped (11/02/24 0331)   heparin 450 Units/hr (11/02/24 0627)   PRN Meds: acetaminophen **OR** acetaminophen (TYLENOL) oral liquid 160 mg/5 mL **OR** acetaminophen, mouth rinse   Vital Signs  Vitals:   11/02/24 0300 11/02/24 0400 11/02/24 0500 11/02/24 0600  BP: (!) 169/62 (!) 161/61  (!) 168/60  Pulse: (!) 44 (!) 53 (!) 50 (!) 47  Resp: 14 18 16 16   Temp:      TempSrc:      SpO2: 90% 94% 95% 96%  Weight:      Height:        Intake/Output Summary (Last 24 hours) at 11/02/2024 0809 Last data filed at 11/02/2024 9372 Gross per 24 hour  Intake 444.07 ml  Output --  Net 444.07 ml      10/31/2024    1:15 PM 05/08/2020    4:52 PM 06/18/2015    7:33 AM  Last 3 Weights  Weight (lbs) 173 lb 1 oz 173 lb 180 lb  Weight (kg) 78.5 kg 78.472 kg 81.647 kg      Telemetry SR, sinus brady - Personally Reviewed  ECG  N/a - Personally Reviewed  Physical Exam  GEN: No acute distress.   Neck: No JVD Cardiac: RRR, no murmurs, rubs, or gallops.  Respiratory: Clear to auscultation bilaterally. GI: Soft, nontender, non-distended  MS: No edema; No deformity. Neuro:  Nonfocal  Psych: Normal affect   Labs High Sensitivity Troponin:  No results for input(s): TROPONINIHS in the last 720 hours.   Chemistry Recent Labs  Lab 10/31/24 1313  NA 141  K 3.8  CL 103  CO2 25  GLUCOSE 105*  BUN 18  CREATININE 1.38*  CALCIUM 9.3  MG 2.0  PROT 7.4  ALBUMIN 4.5  AST 33  ALT 22  ALKPHOS 68  BILITOT 0.6  GFRNONAA 36*   ANIONGAP 14    Lipids  Recent Labs  Lab 11/01/24 0447  CHOL 156  TRIG 99  HDL 43  LDLCALC 94  CHOLHDL 3.7    Hematology Recent Labs  Lab 10/31/24 1313 11/01/24 0447 11/02/24 0457  WBC 7.0 7.9 9.1  RBC 4.11 4.12 4.02  HGB 13.6 13.7 13.4  HCT 39.0 39.2 38.6  MCV 94.9 95.1 96.0  MCH 33.1 33.3 33.3  MCHC 34.9 34.9 34.7  RDW 12.7 12.6 12.7  PLT 220 221 215   Thyroid   Recent Labs  Lab 10/31/24 1313  TSH 3.430    BNPNo results for input(s): BNP, PROBNP in the last 168 hours.  DDimer  Recent Labs  Lab 10/31/24 2337  DDIMER 0.52*     Radiology  ECHOCARDIOGRAM COMPLETE Result Date: 11/01/2024    ECHOCARDIOGRAM REPORT   Patient Name:   Melissa Kane Date of Exam: 11/01/2024 Medical Rec #:  [id number]     Height:       65.0 in Accession #:    [id number]    Weight:       173.1 lb Date of Birth:  1931-11-10     BSA:          1.860 m Patient Age:    89 years      BP:           153/87 mmHg Patient Gender: F             HR:           64 bpm. Exam Location:  [facility] Procedure: 2D Echo, Cardiac Doppler, Color Doppler and Intracardiac            Opacification Agent (Both Spectral and Color Flow Doppler were            utilized during procedure). Indications:    Stroke. New AFIB.  History:        Patient has no prior history of Echocardiogram examinations.                 Abnormal ECG, Stroke, Arrythmias:Atrial Fibrillation,                 Signs/Symptoms:Shortness of Breath and Dyspnea; Risk                 Factors:Hypertension, Dyslipidemia and Diabetes.  Sonographer:    Ellouise Mose RDCS Referring Phys: [phone] BERNARDINO KATHEE COME IMPRESSIONS  1. Left ventricular ejection fraction, by estimation, is 50 to 55%. The left ventricle has low normal function. The left ventricle has no regional wall motion abnormalities. There is moderate left ventricular hypertrophy. Left ventricular diastolic parameters are consistent with Grade I diastolic dysfunction (impaired relaxation). Elevated left atrial  pressure.  2. Right ventricular systolic function is normal. The right ventricular size is normal. Tricuspid regurgitation signal is inadequate for assessing PA pressure.  3. The mitral valve is degenerative. Trivial mitral valve regurgitation. No evidence of mitral stenosis. Severe mitral annular calcification.  4. The aortic valve is tricuspid. There is mild calcification of the aortic valve. There is mild thickening of the aortic valve. Aortic valve regurgitation is not visualized. Aortic valve sclerosis is present, with no evidence of aortic valve stenosis.  5. The inferior vena cava is normal in size with greater than 50% respiratory variability, suggesting right atrial pressure of 3 mmHg. FINDINGS  Left Ventricle: Papillary muscle calcification. Left ventricular ejection fraction, by estimation, is 50 to 55%. The left ventricle has low normal function. The left ventricle has no regional wall motion abnormalities. Definity contrast agent was given IV to delineate the left ventricular endocardial borders. The left ventricular internal cavity size was normal in size. There is moderate left ventricular hypertrophy. Left ventricular diastolic parameters are consistent with Grade I diastolic dysfunction (impaired relaxation). Elevated left atrial pressure. Right Ventricle: The right ventricular size is normal. Right vetricular wall thickness was not well visualized. Right ventricular systolic function is normal. Tricuspid regurgitation signal is inadequate for assessing PA pressure. Left Atrium: Left atrial size was normal in size. Right Atrium: Right atrial size was normal in size. Pericardium: There is no evidence of pericardial effusion. Presence of epicardial fat layer. Mitral Valve: The mitral valve is degenerative in appearance. There is moderate thickening of the posterior mitral valve leaflet(s). There is mild calcification of the posterior mitral valve leaflet(s). Severe mitral annular calcification.  Trivial mitral  valve regurgitation. No evidence of mitral valve stenosis. MV peak gradient, 8.2 mmHg. The mean mitral valve gradient is 2.0 mmHg. Tricuspid Valve: The tricuspid valve is normal in structure. Tricuspid valve regurgitation is trivial. No evidence of tricuspid stenosis. Aortic Valve: The  aortic valve is tricuspid. There is mild calcification of the aortic valve. There is mild thickening of the aortic valve. There is mild aortic valve annular calcification. Aortic valve regurgitation is not visualized. Aortic valve sclerosis is present, with no evidence of aortic valve stenosis. Aortic valve mean gradient measures 3.9 mmHg. Aortic valve peak gradient measures 7.6 mmHg. Aortic valve area, by VTI measures 2.90 cm. Pulmonic Valve: The pulmonic valve was not well visualized. Pulmonic valve regurgitation is mild. No evidence of pulmonic stenosis. Aorta: The aortic root and ascending aorta are structurally normal, with no evidence of dilitation. Venous: The inferior vena cava is normal in size with greater than 50% respiratory variability, suggesting right atrial pressure of 3 mmHg. IAS/Shunts: No atrial level shunt detected by color flow Doppler.  LEFT VENTRICLE PLAX 2D LVIDd:         3.70 cm     Diastology LVIDs:         2.90 cm     LV e' medial:    7.05 cm/s LV PW:         1.30 cm     LV E/e' medial:  12.7 LV IVS:        1.50 cm     LV e' lateral:   5.03 cm/s LVOT diam:     2.10 cm     LV E/e' lateral: 17.9 LV SV:         91 LV SV Index:   49 LVOT Area:     3.46 cm  LV Volumes (MOD) LV vol d, MOD A2C: 50.5 ml LV vol d, MOD A4C: 55.0 ml LV vol s, MOD A2C: 14.2 ml LV vol s, MOD A4C: 24.2 ml LV SV MOD A2C:     36.3 ml LV SV MOD A4C:     55.0 ml LV SV MOD BP:      33.2 ml RIGHT VENTRICLE            IVC RV S prime:     6.96 cm/s  IVC diam: 1.60 cm TAPSE (M-mode): 1.3 cm                            PULMONARY VEINS                            Diastolic Velocity: 35.50 cm/s                            S/D Velocity:        0.80                            Systolic Velocity:  29.60 cm/s LEFT ATRIUM             Index        RIGHT ATRIUM           Index LA diam:        3.20 cm 1.72 cm/m   RA Area:     12.10 cm LA Vol (A2C):   23.2 ml 12.47 ml/m  RA Volume:   27.30 ml  14.68 ml/m LA Vol (A4C):   46.2 ml 24.84 ml/m LA Biplane Vol: 36.4 ml 19.57 ml/m  AORTIC VALVE AV Area (Vmax):    2.70 cm AV Area (Vmean):   2.63 cm AV Area (VTI):  2.90 cm AV Vmax:           137.51 cm/s AV Vmean:          95.528 cm/s AV VTI:            0.313 m AV Peak Grad:      7.6 mmHg AV Mean Grad:      3.9 mmHg LVOT Vmax:         107.00 cm/s LVOT Vmean:        72.600 cm/s LVOT VTI:          0.262 m LVOT/AV VTI ratio: 0.84  AORTA Ao Root diam: 3.40 cm Ao Asc diam:  3.20 cm MITRAL VALVE MV Area (PHT): 2.87 cm     SHUNTS MV Area VTI:   2.17 cm     Systemic VTI:  0.26 m MV Peak grad:  8.2 mmHg     Systemic Diam: 2.10 cm MV Mean grad:  2.0 mmHg MV Vmax:       1.43 m/s MV Vmean:      61.4 cm/s MV Decel Time: 264 msec MV E velocity: 89.80 cm/s MV A velocity: 127.00 cm/s MV E/A ratio:  0.71 Dorn Ross MD Electronically signed by Dorn Ross MD Signature Date/Time: 11/01/2024/1:36:07 PM    Final    CT ANGIO HEAD NECK W WO CM Result Date: 10/31/2024 EXAM: CTA Head and Neck with Intravenous Contrast. CT Head without Contrast. CLINICAL HISTORY: Neuro deficit, acute, stroke suspected. TECHNIQUE: Axial CTA images of the head and neck performed with intravenous contrast. MIP reconstructed images were created and reviewed. Axial computed tomography images of the head/brain performed without intravenous contrast. Note: Per PQRS, the description of internal carotid artery percent stenosis, including 0 percent or normal exam, is based on [employer] American Symptomatic Carotid Endarterectomy Trial (NASCET) criteria. Dose reduction technique was used including one or more of the following: automated exposure control, adjustment of mA and kV according to patient  size, and/or iterative reconstruction. CONTRAST: 60mL iohexol (OMNIPAQUE) 350 MG/ML injection. COMPARISON: None provided. FINDINGS: BRAIN: No acute intraparenchymal hemorrhage. No mass lesion. No acute infarct better characterized on same day MRI of the head. No midline shift or extra-axial collection. VENTRICLES: No hydrocephalus. ORBITS: The orbits are unremarkable. SINUSES AND MASTOIDS: The paranasal sinuses and mastoid air cells are clear. COMMON CAROTID ARTERIES: No significant stenosis. No dissection or occlusion. INTERNAL CAROTID ARTERIES: No stenosis by NASCET criteria. No dissection or occlusion. VERTEBRAL ARTERIES: No significant stenosis. No dissection or occlusion. ANTERIOR CEREBRAL ARTERIES: No significant stenosis. No occlusion. No aneurysm. MIDDLE CEREBRAL ARTERIES: No significant stenosis. No occlusion. No aneurysm. POSTERIOR CEREBRAL ARTERIES: No significant stenosis. No occlusion. No aneurysm. BASILAR ARTERY: No significant stenosis. No occlusion. No aneurysm. SOFT TISSUES: No acute finding. No masses or lymphadenopathy. BONES: No acute osseous abnormality. IMPRESSION: 1. No large vessel occlusion or significant stenosis. Electronically signed by: Gilmore Molt MD 10/31/2024 03:24 PM EST RP Workstation: [id number]   MR BRAIN WO CONTRAST Result Date: 10/31/2024 EXAM: MRI BRAIN WITHOUT CONTRAST 10/31/2024 02:30:59 PM TECHNIQUE: Multiplanar multisequence MRI of the head/brain was performed without the administration of intravenous contrast. COMPARISON: None available. CLINICAL HISTORY: Neuro deficit, acute, stroke suspected Neuro deficit, acute, stroke suspected FINDINGS: BRAIN AND VENTRICLES: Acute right frontal cortical infarct. No intracranial hemorrhage. No mass. No midline shift. No hydrocephalus. Mild for age scattered T2/FLAIR hyperintensities in the white matter, compatible with chronic microvascular ischemic change. Normal flow voids. ORBITS: No acute abnormality. SINUSES AND MASTOIDS:  No acute abnormality. BONES  AND SOFT TISSUES: Normal marrow signal. No acute soft tissue abnormality. IMPRESSION: 1. Acute right frontal cortical infarct. Electronically signed by: Gilmore Molt MD 10/31/2024 03:06 PM EST RP Workstation: [id number]    Cardiac Studies   Patient Profile   Melissa Kane is a 89 y.o. female with a hx of HTN, HLD, Type II DM and hypothyroidism who is being seen 11/01/2024 for the evaluation of atrial fibrillation with RVR at the request of Dr. Bryn.  Assessment & Plan   1.Afib - new diagnosis this admission. Has been paroxysmal. In setting of CVA this admission possibly afib related - started on IV amiodarone by overnight team to allow for ongoing permissive hypertension given CVA - can continue amiodarone for time being, likely overall short course would be planned. Continue IV until tomorrow, likely transition to oral tomorrow for a few weeks then discontinue as outpatient. - can continue lopressor 25mg  bid   - with recent CVA defer use of anticoagulation to neurology, currently on hep gtt. Transition to DOAC if ok with neuro from CVA standpoint.  - echo LVEF 50-55%, no WMAs, grade I dd, normal LA - low heart rates at times on amio and lopressor, will d/c lopressor - stop IV amio, start oral amio 200mg  bid x 2 weeks then 200mg  daily. Would plan to discontinue amio as outpatient at f/u and monitor clinically. Not clear at this time she required a long term commitment to amiodarone, may be able to manage with av nodal agents alone.  -      2.CVA - per primary team and neuro  No additional cardiology recs at this time, we will sign off inpatient care.   For questions or updates, please contact [facility] Please consult [email or web address] for contact info under       Signed, Alvan Carrier, MD  11/02/2024, 8:09 AM

## 2024-11-02 NOTE — Discharge Instructions (Signed)
Information on my medicine - ELIQUIS® (apixaban) ° °This medication education was reviewed with me or my healthcare representative as part of my discharge preparation.  The pharmacist that spoke with me during my hospital stay was:  Jannel Lynne C Antwan Pandya, RPH ° °Why was Eliquis® prescribed for you? °Eliquis® was prescribed for you to reduce the risk of a blood clot forming that can cause a stroke if you have a medical condition called atrial fibrillation (a type of irregular heartbeat). ° °What do You need to know about Eliquis® ? °Take your Eliquis® TWICE DAILY - one tablet in the morning and one tablet in the evening with or without food. If you have difficulty swallowing the tablet whole please discuss with your pharmacist how to take the medication safely. ° °Take Eliquis® exactly as prescribed by your doctor and DO NOT stop taking Eliquis® without talking to the doctor who prescribed the medication.  Stopping may increase your risk of developing a stroke.  Refill your prescription before you run out. ° °After discharge, you should have regular check-up appointments with your healthcare provider that is prescribing your Eliquis®.  In the future your dose may need to be changed if your kidney function or weight changes by a significant amount or as you get older. ° °What do you do if you miss a dose? °If you miss a dose, take it as soon as you remember on the same day and resume taking twice daily.  Do not take more than one dose of ELIQUIS at the same time to make up a missed dose. ° °Important Safety Information °A possible side effect of Eliquis® is bleeding. You should call your healthcare provider right away if you experience any of the following: °? Bleeding from an injury or your nose that does not stop. °? Unusual colored urine (red or dark brown) or unusual colored stools (red or black). °? Unusual bruising for unknown reasons. °? A serious fall or if you hit your head (even if there is no bleeding). ° °Some  medicines may interact with Eliquis® and might increase your risk of bleeding or clotting while on Eliquis®. To help avoid this, consult your healthcare provider or pharmacist prior to using any new prescription or non-prescription medications, including herbals, vitamins, non-steroidal anti-inflammatory drugs (NSAIDs) and supplements. ° °This website has more information on Eliquis® (apixaban): http://www.eliquis.com/eliquis/home °

## 2024-11-02 NOTE — Telephone Encounter (Signed)
 Pharmacy Patient Advocate Encounter  Received notification from [employer] that Prior Authorization for Freestyle Libre 3 Plus Sensor has been DENIED.  Full denial letter will be uploaded to the media tab. See denial reason below.   PA #/Case ID/Reference #: [id number]

## 2024-11-04 LAB — GLUCOSE, CAPILLARY: Glucose-Capillary: 178 mg/dL — ABNORMAL HIGH (ref 70–99)

## 2024-11-28 ENCOUNTER — Telehealth: Payer: Self-pay

## 2024-11-28 NOTE — Progress Notes (Signed)
 Cardiology Office Note    Date:  11/30/2024  ID:  EMRYN Kane, DOB March 27, 1931, MRN [id number] Cardiologist: Alvan Carrier, MD { : History of Present Illness:    Melissa Kane is a 89 y.o. female with past medical history of HTN, HLD, Type II DM and hypothyroidism who presents to the office today for hospital follow-up.  She was recently admitted to [facility] on 10/31/2024 for evaluation of facial asymmetry and difficulty speaking. Was found to have an acute right frontal cortical infarct. During admission, she did have episodes of atrial fibrillation and did convert back to normal sinus rhythm with IV Amiodarone . It was recommended to continue this for a short course and Amiodarone  was transitioned to 200 mg twice daily for 2 weeks followed by 200 mg daily with plans to discontinue at outpatient follow-up and monitor clinically. Was started on Lopressor  25 mg twice daily and Eliquis  5 mg twice daily for anticoagulation.  In the interim, the office received a preoperative cardiac clearance request for one dental extraction and was recommended to address at the time of her visit.  In talking with the patient today, she reports overall doing well since her recent hospitalization. She denies any residual neurological deficits. She has been very active at home and was recently getting up leaves yesterday. Performs all the routine chores around her home without any chest pain, palpitations or dyspnea on exertion. No specific orthopnea, PND or lower extremity edema. She has been on Eliquis  and denies any bleeding issues with this. She is concerned about the cost as she had not yet met her deductible for her medications this year and is going to have to pay several hundred dollars for her next refill and then pay her deductible in 12/2024 as well.  Studies Reviewed:   EKG: EKG is ordered today and demonstrates:   EKG Interpretation Date/Time:  Thursday November 29 2024 13:05:22 EST Ventricular  Rate:  70 PR Interval:  176 QRS Duration:  76 QT Interval:  368 QTC Calculation: 397 R Axis:   16  Text Interpretation: Normal sinus rhythm Low voltage QRS When compared with ECG of 31-Oct-2024 13:18, PREVIOUS ECG IS PRESENT Confirmed by Melissa Kane ([id number]) on 11/29/2024 1:24:01 PM       Echocardiogram: 10/2024 IMPRESSIONS     1. Left ventricular ejection fraction, by estimation, is 50 to 55%. The  left ventricle has low normal function. The left ventricle has no regional  wall motion abnormalities. There is moderate left ventricular hypertrophy.  Left ventricular diastolic  parameters are consistent with Grade I diastolic dysfunction (impaired  relaxation). Elevated left atrial pressure.   2. Right ventricular systolic function is normal. The right ventricular  size is normal. Tricuspid regurgitation signal is inadequate for assessing  PA pressure.   3. The mitral valve is degenerative. Trivial mitral valve regurgitation.  No evidence of mitral stenosis. Severe mitral annular calcification.   4. The aortic valve is tricuspid. There is mild calcification of the  aortic valve. There is mild thickening of the aortic valve. Aortic valve  regurgitation is not visualized. Aortic valve sclerosis is present, with  no evidence of aortic valve stenosis.   5. The inferior vena cava is normal in size with greater than 50%  respiratory variability, suggesting right atrial pressure of 3 mmHg.    Risk Assessment/Calculations:    CHA2DS2-VASc Score = 7  This indicates a 11.2% annual risk of stroke. The patient's score is based upon: CHF History: 0 HTN  History: 1 Diabetes History: 1 Stroke History: 2 Vascular Disease History: 0 Age Score: 2 Gender Score: 1    HYPERTENSION CONTROL Vitals:   11/29/24 1301 11/29/24 1343  BP: (!) 158/92 (!) 142/92    The patient's blood pressure is elevated above target today. In order to address the patient's elevated BP: Blood pressure  will be monitored at home to determine if medication changes need to be made.     Physical Exam:   VS:  BP (!) 142/92   Pulse 70   Ht 5' 5 (1.651 m)   Wt 157 lb 9.6 oz (71.5 kg)   SpO2 96%   BMI 26.23 kg/m    Wt Readings from Last 3 Encounters:  11/29/24 157 lb 9.6 oz (71.5 kg)  10/31/24 173 lb 1 oz (78.5 kg)  05/08/20 173 lb (78.5 kg)     GEN: Well nourished, well developed female appearing in no acute distress NECK: No JVD; No carotid bruits CARDIAC: RRR, no murmurs, rubs, gallops RESPIRATORY:  Clear to auscultation without rales, wheezing or rhonchi  ABDOMEN: Appears non-distended. No obvious abdominal masses. EXTREMITIES: No clubbing or cyanosis. No pitting edema.  Distal pedal pulses are 2+ bilaterally.   Assessment and Plan:   1. PAF (paroxysmal atrial fibrillation) (HCC) - This was diagnosed during her recent admission she did convert back to normal sinus rhythm with IV Amiodarone . She is currently taking Amiodarone  200 mg twice daily and will reduce to 200 mg daily for 1 month and then have her stop. She has not required the use of AV nodal blocking agents. - Continue Eliquis  5 mg twice daily for anticoagulation which is the appropriate dose given her current age, weight and renal function (creatinine at 1.36 when checked on 11/02/2024).  2. Essential hypertension - Blood pressure is elevated today but she reports this is typically well-controlled. For now, continue Amlodipine 2.5 mg daily. If blood pressure remains elevated, will increase to 5 mg daily. She was provided with a BP log.  3. Hyperlipidemia LDL goal <70 - LDL was at 94 when checked in 10/2024 and statin therapy was adjusted. Remains on Crestor  10 mg daily for now. Goal LDL should be less than 70 given her recent CVA and if this remains above goal, would titrate Crestor  to 20 mg daily.  4. History of CVA (cerebrovascular accident) - Diagnosed during her recent admission and felt to be embolic in the  setting of newly diagnosed atrial fibrillation. She was started on anticoagulation as discussed above.  5.  Preoperative cardiac clearance for dental extraction - Given that she is having a simple dental extraction with 1 tooth removed, anticoagulation typically does not need to be held for this. We reviewed that if absolutely indicated, Eliquis  could be held for 48 hours but otherwise should be continued. She does not require SBP prophylaxis and no further cardiac testing is indicated.  Signed, Laymon CHRISTELLA Qua, PA-C

## 2024-11-28 NOTE — Telephone Encounter (Signed)
 Attempted to reach patient and the phone just kept ringing and no answer. Will try again later. 1st attempt.

## 2024-11-28 NOTE — Telephone Encounter (Signed)
   Name: Melissa Kane  DOB: 1931-02-04  MRN: [id number]  Primary Cardiologist: None  Chart reviewed as part of pre-operative protocol coverage. Because of Melissa Kane past medical history and time since last visit, she will require a follow-up in-office visit in order to better assess preoperative cardiovascular risk.  Pre-op covering staff: - Please schedule appointment and call patient to inform them. If patient already had an upcoming appointment within acceptable timeframe, please add pre-op clearance to the appointment notes so provider is aware. - Please contact requesting surgeon's office via preferred method (i.e, phone, fax) to inform them of need for appointment prior to surgery.   Pt had a stroke 10/2024 with newly diagnosed Afib.   Has an appt with Laymon Qua [facility] tomorrow.    Jon Garre Thaniel Coluccio, PA  11/28/2024, 2:52 PM

## 2024-11-28 NOTE — Telephone Encounter (Signed)
   Pre-operative Risk Assessment    Patient Name: Melissa Kane  DOB: Jul 30, 1931 MRN: [id number]   Date of last office visit: 03/14/12  Dr. Debera Date of next office visit: 11/29/24  Laymon Qua, PA  Request for Surgical Clearance    Procedure:  Dental Extraction - Amount of Teeth to be Pulled:  Surgical removal with bone grafting of 1 maxillary tooth  Date of Surgery:  Clearance TBD                                Surgeon:  Ozell Hurdle, DDS, MD Surgeon's Group or Practice Name:  [facility] Phone number:  [phone] Fax number:  [phone]  Type of Clearance Requested:   - Medical    Type of Anesthesia:  Not Indicated   Additional requests/questions:    Bonney Arlyne LITTIE Kallie   11/28/2024, 1:49 PM

## 2024-11-29 ENCOUNTER — Ambulatory Visit: Attending: Student | Admitting: Student

## 2024-11-29 ENCOUNTER — Encounter: Payer: Self-pay | Admitting: Student

## 2024-11-29 VITALS — BP 142/92 | HR 70 | Ht 65.0 in | Wt 157.6 lb

## 2024-11-29 DIAGNOSIS — I480 Paroxysmal atrial fibrillation: Secondary | ICD-10-CM

## 2024-11-29 DIAGNOSIS — E785 Hyperlipidemia, unspecified: Secondary | ICD-10-CM

## 2024-11-29 DIAGNOSIS — Z01810 Encounter for preprocedural cardiovascular examination: Secondary | ICD-10-CM

## 2024-11-29 DIAGNOSIS — I10 Essential (primary) hypertension: Secondary | ICD-10-CM

## 2024-11-29 DIAGNOSIS — Z8673 Personal history of transient ischemic attack (TIA), and cerebral infarction without residual deficits: Secondary | ICD-10-CM

## 2024-11-29 MED ORDER — AMIODARONE HCL 200 MG PO TABS: 200.0000 mg | ORAL_TABLET | Freq: Every day | ORAL | 0 refills | Status: AC

## 2024-11-29 MED ORDER — APIXABAN 5 MG PO TABS: 5.0000 mg | ORAL_TABLET | Freq: Two times a day (BID) | ORAL | 0 refills | Status: DC

## 2024-11-29 MED ORDER — APIXABAN 5 MG PO TABS: 5.0000 mg | ORAL_TABLET | Freq: Two times a day (BID) | ORAL | 3 refills | Status: AC

## 2024-11-29 MED ORDER — ROSUVASTATIN CALCIUM 10 MG PO TABS: 10.0000 mg | ORAL_TABLET | Freq: Every day | ORAL | 2 refills | Status: AC

## 2024-11-29 NOTE — Patient Instructions (Addendum)
 Medication Instructions:  DECREASE Amiodarone  to 200 mg daily for 1 month and then STOP  Samples Eliquis  given.  Labwork: None today  Testing/Procedures: None today  Follow-Up: 4-5 months with B.Starlee Corralejo,PA-C or Dr.Branch  Any Other Special Instructions Will Be Listed Below (If Applicable).   Keep daily blood pressure log for 1 month  If you need a refill on your cardiac medications before your next appointment, please call your pharmacy.

## 2024-11-30 ENCOUNTER — Other Ambulatory Visit: Payer: Self-pay

## 2024-11-30 ENCOUNTER — Encounter: Payer: Self-pay | Admitting: Student

## 2024-11-30 NOTE — Telephone Encounter (Signed)
 Please close the loop with the patient that we already have what we need so she isn't left wondering. Thanks!

## 2024-11-30 NOTE — Telephone Encounter (Signed)
 Called and spoke with patient, and she stated that she was seen yesterday with B. Strader, PA and was told that she was cleared for dental procedure. Please advise on next step.

## 2024-11-30 NOTE — Telephone Encounter (Signed)
 Patient was seen on 11/29/24 with Melissa Qua, PA. Clearance was done at this time the patient informed me. Clearance will be sent to requesting office and removed from pre-op pool.

## 2024-12-04 ENCOUNTER — Encounter: Payer: Self-pay | Admitting: Diagnostic Neuroimaging

## 2024-12-04 ENCOUNTER — Ambulatory Visit: Admitting: Diagnostic Neuroimaging

## 2024-12-04 VITALS — BP 154/92 | HR 69 | Ht 65.0 in | Wt 160.8 lb

## 2024-12-04 DIAGNOSIS — I639 Cerebral infarction, unspecified: Secondary | ICD-10-CM

## 2024-12-04 NOTE — Patient Instructions (Signed)
  STROKE PREVENTION  - eliquis  5mg  twice a day for atrial fibrillation - monitor BP at home; may need to go up to amlodipine 5mg  daily - continue diabetes control (A1c on monitor is now 6.2) - continue rosuvastatin  10mg  daily

## 2024-12-04 NOTE — Progress Notes (Signed)
 [facility]  PATIENT: Melissa Kane DOB: 16-Jul-1931  REFERRING CLINICIAN: Shelton Arlin KIDD, MD HISTORY FROM: patient  REASON FOR VISIT: new consult   HISTORICAL  CHIEF COMPLAINT:  Chief Complaint  Patient presents with   RM 7     Patient is here with son [name] for stroke - has been doing good no concerns  BP RECHECK     HISTORY OF PRESENT ILLNESS:   89 year old female here for evaluation of hospital stroke follow-up.  On 10/30/24 patient had new onset of right sided facial drooling and numbness.  Patient went to the hospital the next day for evaluation.  She was admitted for stroke workup.  She was found to have abnormal DWI signal in the right frontal region, although this did not correlate with location of symptoms.  She was also found to have atrial fibrillation and started on anticoagulation.  A1c was also found to be elevated at 9.6, after patient had stopped her medication the previous year.  Patient was managed medically.  She was discharged home and followed up with PCP.  Patient tolerating medications.  She only has very minimal numbness on her right face that is left over.    REVIEW OF SYSTEMS: Full 14 system review of systems performed and negative with exception of: as per HPI.  ALLERGIES: No Known Allergies  HOME MEDICATIONS: Outpatient Medications Prior to Visit  Medication Sig Dispense Refill   amiodarone  (PACERONE ) 200 MG tablet Take 1 tablet (200 mg total) by mouth daily. 30 tablet 0   amLODipine (NORVASC) 2.5 MG tablet Take 2.5 mg by mouth at bedtime.     apixaban  (ELIQUIS ) 5 MG TABS tablet Take 1 tablet (5 mg total) by mouth 2 (two) times daily. 180 tablet 3   cholecalciferol (VITAMIN D3) 25 MCG (1000 UNIT) tablet Take 1,000 Units by mouth at bedtime.     Coenzyme Q10 50 MG CAPS Take 1 capsule by mouth at bedtime.     diphenhydrAMINE (BENADRYL) 25 MG tablet Take 25 mg by mouth at bedtime.      glipiZIDE (GLUCOTROL XL) 5 MG 24 hr  tablet Take 5 mg by mouth 2 (two) times daily.     levothyroxine (SYNTHROID) 25 MCG tablet Take 25 mcg by mouth daily.     Omega-3 Fatty Acids (FISH OIL PO) Take 1 capsule by mouth at bedtime.     pantoprazole  (PROTONIX ) 20 MG tablet Take 1 tablet (20 mg total) by mouth daily. 30 tablet 0   rosuvastatin  (CRESTOR ) 10 MG tablet Take 1 tablet (10 mg total) by mouth daily. 90 tablet 2   vitamin B-12 (CYANOCOBALAMIN ) 100 MCG tablet Take 100 mcg by mouth at bedtime.     apixaban  (ELIQUIS ) 5 MG TABS tablet Take 1 tablet (5 mg total) by mouth 2 (two) times daily. 70 tablet 0   No facility-administered medications prior to visit.    PAST MEDICAL HISTORY: Past Medical History:  Diagnosis Date   Diabetes mellitus without complication (HCC)    Essential hypertension, benign    Hypercholesteremia    Hyperlipidemia    Impaired glucose tolerance    Thyroid  disease     PAST SURGICAL HISTORY: Past Surgical History:  Procedure Laterality Date   APPENDECTOMY     COLONOSCOPY     Patient states she had in [place]/Dr.Rehman greater than 6 years,   COLONOSCOPY N/A 06/18/2015   Procedure: COLONOSCOPY;  Surgeon: Claudis RAYMOND Rivet, MD;  Location: [facility];  Service: Endoscopy;  Laterality:  N/A;  830 -- in OR under fluoro   Excision left breast cyst     TUBAL LIGATION      FAMILY HISTORY: Family History  Problem Relation Age of Onset   Cancer Father        Leukemia   Cancer Sister        Pancreatic   Cancer Sister        Colon   COPD Sister    Sleep apnea Other    Stroke Neg Hx    Seizures Neg Hx    Migraines Neg Hx     SOCIAL HISTORY: Social History   Socioeconomic History   Marital status: Married    Spouse name: Not on file   Number of children: Not on file   Years of education: Not on file   Highest education level: Not on file  Occupational History   Occupation: Public Affairs Consultant: RETIRED  Tobacco Use   Smoking status: Former    Types: Cigarettes   Smokeless tobacco:  Never  Vaping Use   Vaping status: Never Used  Substance and Sexual Activity   Alcohol use: No    Alcohol/week: 0.0 standard drinks of alcohol   Drug use: No   Sexual activity: Not Currently  Other Topics Concern   Not on file  Social History Narrative   2 mugs of coffee daily    Social Drivers of Health   Financial Resource Strain: Not on file  Food Insecurity: No Food Insecurity (10/31/2024)   Hunger Vital Sign    Worried About Running Out of Food in the Last Year: Never true    Ran Out of Food in the Last Year: Never true  Transportation Needs: No Transportation Needs (10/31/2024)   PRAPARE - Administrator, Civil Service (Medical): No    Lack of Transportation (Non-Medical): No  Physical Activity: Not on file  Stress: Not on file  Social Connections: Moderately Isolated (10/31/2024)   Social Connection and Isolation Panel    Frequency of Communication with Friends and Family: Twice a week    Frequency of Social Gatherings with Friends and Family: Once a week    Attends Religious Services: 1 to 4 times per year    Active Member of [employer] or Organizations: No    Attends Banker Meetings: Never    Marital Status: Widowed  Intimate Partner Violence: Not At Risk (10/31/2024)   Humiliation, Afraid, Rape, and Kick questionnaire    Fear of Current or Ex-Partner: No    Emotionally Abused: No    Physically Abused: No    Sexually Abused: No     PHYSICAL EXAM  GENERAL EXAM/CONSTITUTIONAL: Vitals:  Vitals:   12/04/24 1015 12/04/24 1121  BP: (!) 185/90 (!) 154/92  Pulse: 69   Weight: 160 lb 12.8 oz (72.9 kg)   Height: 5' 5 (1.651 m)    Body mass index is 26.76 kg/m. Wt Readings from Last 3 Encounters:  12/04/24 160 lb 12.8 oz (72.9 kg)  11/29/24 157 lb 9.6 oz (71.5 kg)  10/31/24 173 lb 1 oz (78.5 kg)   Patient is in no distress; well developed, nourished and groomed; neck is supple  CARDIOVASCULAR: Examination of carotid arteries is normal; no  carotid bruits Regular rate and rhythm, no murmurs Examination of peripheral vascular system by observation and palpation is normal  EYES: Ophthalmoscopic exam of optic discs and posterior segments is normal; no papilledema or hemorrhages No results found.  MUSCULOSKELETAL: Gait,  strength, tone, movements noted in Neurologic exam below  NEUROLOGIC: MENTAL STATUS:      No data to display         awake, alert, oriented to person, place and time recent and remote memory intact normal attention and concentration language fluent, comprehension intact, naming intact fund of knowledge appropriate  CRANIAL NERVE:  2nd - no papilledema on fundoscopic exam 2nd, 3rd, 4th, 6th - pupils equal and reactive to light, visual fields full to confrontation, extraocular muscles intact, no nystagmus 5th - facial sensation symmetric 7th - facial strength symmetric 8th - hearing intact 9th - palate elevates symmetrically, uvula midline 11th - shoulder shrug symmetric 12th - tongue protrusion midline  MOTOR:  normal bulk and tone, full strength in the BUE, BLE SUBTLE DECR FINGER TAPPING SPEED IN LEFT HAND; RIGHT ARM SLIGHTLY ORBITS AROUND LEFT  SENSORY:  normal and symmetric to light touch, temperature, vibration  COORDINATION:  finger-nose-finger, fine finger movements normal  REFLEXES:  deep tendon reflexes TRACE and symmetric  GAIT/STATION:  narrow based gait     DIAGNOSTIC DATA (LABS, IMAGING, TESTING) - I reviewed patient records, labs, notes, testing and imaging myself where available.  Lab Results  Component Value Date   WBC 9.1 11/02/2024   HGB 13.4 11/02/2024   HCT 38.6 11/02/2024   MCV 96.0 11/02/2024   PLT 215 11/02/2024      Component Value Date/Time   NA 139 11/02/2024 0841   K 3.5 11/02/2024 0841   CL 102 11/02/2024 0841   CO2 26 11/02/2024 0841   GLUCOSE 173 (H) 11/02/2024 0841   BUN 18 11/02/2024 0841   CREATININE 1.36 (H) 11/02/2024 0841   CALCIUM   9.1 11/02/2024 0841   PROT 7.4 10/31/2024 1313   ALBUMIN 4.5 10/31/2024 1313   AST 33 10/31/2024 1313   ALT 22 10/31/2024 1313   ALKPHOS 68 10/31/2024 1313   BILITOT 0.6 10/31/2024 1313   GFRNONAA 37 (L) 11/02/2024 0841   GFRAA 48 (L) 05/08/2020 1752   Lab Results  Component Value Date   CHOL 156 11/01/2024   HDL 43 11/01/2024   LDLCALC 94 11/01/2024   TRIG 99 11/01/2024   CHOLHDL 3.7 11/01/2024   Lab Results  Component Value Date   HGBA1C 9.6 (H) 10/31/2024   No results found for: VITAMINB12 Lab Results  Component Value Date   TSH 3.430 10/31/2024   11/01/24 TTE 1. Left ventricular ejection fraction, by estimation, is 50 to 55%. The  left ventricle has low normal function. The left ventricle has no regional  wall motion abnormalities. There is moderate left ventricular hypertrophy.  Left ventricular diastolic  parameters are consistent with Grade I diastolic dysfunction (impaired  relaxation). Elevated left atrial pressure.   2. Right ventricular systolic function is normal. The right ventricular  size is normal. Tricuspid regurgitation signal is inadequate for assessing  PA pressure.   3. The mitral valve is degenerative. Trivial mitral valve regurgitation.  No evidence of mitral stenosis. Severe mitral annular calcification.   4. The aortic valve is tricuspid. There is mild calcification of the  aortic valve. There is mild thickening of the aortic valve. Aortic valve  regurgitation is not visualized. Aortic valve sclerosis is present, with  no evidence of aortic valve stenosis.   5. The inferior vena cava is normal in size with greater than 50%  respiratory variability, suggesting right atrial pressure of 3 mmHg.   10/31/24 MRI brain [I reviewed images myself and agree with interpretation. -VRP]  -  Acute right frontal cortical infarct.  10/31/24 CTA head / neck - No large vessel occlusion or significant stenosis.      ASSESSMENT AND PLAN  89 y.o. year old  female here with:  Dx:  1. Cardioembolic stroke [facility])     PLAN:  STROKE PREVENTION (cardioembolic source from atrial fibrillation; right facial symptoms not apparent on MRI; right frontal DWI abnormality with subtle left hand motor symptoms) - educated patient on stroke warning signs, symptoms, mechanisms, and risk factors including high blood pressure, atrial fibrillation and hypercholesterolemia - continue eliquis  5mg  twice a day for atrial fibrillation - monitor BP at home; may need to go up to amlodipine 5mg  daily - continue diabetes control (A1c on monitor is now 6.2) - continue rosuvastatin  10mg  daily  Return for return to PCP, pending if symptoms worsen or fail to improve.    EDUARD FABIENE HANLON, MD 12/04/2024, 11:17 AM Certified in Neurology, Neurophysiology and Neuroimaging  [facility] [address], [address] [place], [place] [address] [phone]

## 2025-01-03 ENCOUNTER — Telehealth: Payer: Self-pay | Admitting: Student

## 2025-01-03 NOTE — Telephone Encounter (Signed)
 Pt c/o medication issue:  1. Name of Medication: apixaban  (ELIQUIS ) 5 MG TABS tablet   2. How are you currently taking this medication (dosage and times per day)?   Take 1 tablet (5 mg total) by mouth 2 (two) times daily.    3. Are you having a reaction (difficulty breathing--STAT)? No   4. What is your medication issue? Pt asking if there is any patient assistance available for eliquis 

## 2025-01-07 ENCOUNTER — Other Ambulatory Visit (HOSPITAL_COMMUNITY): Payer: Self-pay

## 2025-01-07 ENCOUNTER — Telehealth: Payer: Self-pay | Admitting: Pharmacy Technician

## 2025-01-07 NOTE — Telephone Encounter (Signed)
 I called the patient and left her a message to call me back to go over options

## 2025-01-07 NOTE — Telephone Encounter (Addendum)
" °  Eliquis  is 249 for 30 days -she said she picked it up on 01/03/25 Xarelto would be 193.54   Dabigatran would be 96.51 for 30 days on insurance or 54.37 on goodrx at [employer] requires 3% of income to of been paid in the year of applying for assistance. [employer] [facility] [employer] does not require that. I called the patient and left her a message to call me back to go over options  Patient called back and said she was told next time her copay would be 49.00. she wants to stay on eliquis . "

## 2025-04-26 ENCOUNTER — Ambulatory Visit: Admitting: Student
